# Patient Record
Sex: Male | Born: 1975 | Race: Black or African American | Hispanic: No | Marital: Single | Smoking: Current every day smoker
Health system: Southern US, Community
[De-identification: ages and names within clinical notes are randomized; demographics above are authoritative.]

## PROBLEM LIST (undated history)

## (undated) DIAGNOSIS — M5430 Sciatica, unspecified side: Secondary | ICD-10-CM

## (undated) DIAGNOSIS — A048 Other specified bacterial intestinal infections: Secondary | ICD-10-CM

## (undated) DIAGNOSIS — K279 Peptic ulcer, site unspecified, unspecified as acute or chronic, without hemorrhage or perforation: Secondary | ICD-10-CM

---

## 2004-10-26 ENCOUNTER — Emergency Department: Payer: Self-pay | Admitting: Emergency Medicine

## 2005-02-13 ENCOUNTER — Emergency Department: Payer: Self-pay | Admitting: Internal Medicine

## 2005-02-19 ENCOUNTER — Emergency Department: Payer: Self-pay | Admitting: Emergency Medicine

## 2005-03-18 ENCOUNTER — Emergency Department: Payer: Self-pay | Admitting: Emergency Medicine

## 2010-10-29 ENCOUNTER — Emergency Department (HOSPITAL_COMMUNITY)
Admission: EM | Admit: 2010-10-29 | Discharge: 2010-10-29 | Disposition: A | Discharge status: Home / Self Care | Attending: Emergency Medicine | Admitting: Emergency Medicine

## 2010-10-29 DIAGNOSIS — R209 Unspecified disturbances of skin sensation: Secondary | ICD-10-CM | POA: Insufficient documentation

## 2010-10-29 DIAGNOSIS — G510 Bell's palsy: Secondary | ICD-10-CM | POA: Insufficient documentation

## 2010-10-29 DIAGNOSIS — G501 Atypical facial pain: Secondary | ICD-10-CM | POA: Insufficient documentation

## 2012-09-30 ENCOUNTER — Emergency Department: Payer: Self-pay | Admitting: Unknown Physician Specialty

## 2013-04-29 ENCOUNTER — Emergency Department: Payer: Self-pay | Admitting: Emergency Medicine

## 2014-03-17 ENCOUNTER — Emergency Department: Payer: Self-pay | Admitting: Emergency Medicine

## 2014-03-17 LAB — URINALYSIS, COMPLETE
BILIRUBIN, UR: NEGATIVE
BLOOD: NEGATIVE
Bacteria: NONE SEEN
Glucose,UR: NEGATIVE mg/dL (ref 0–75)
KETONE: NEGATIVE
NITRITE: NEGATIVE
PROTEIN: NEGATIVE
Ph: 5 (ref 4.5–8.0)
SPECIFIC GRAVITY: 1.025 (ref 1.003–1.030)
Squamous Epithelial: NONE SEEN
WBC UR: 87 /HPF (ref 0–5)

## 2014-03-19 ENCOUNTER — Emergency Department: Payer: Self-pay | Admitting: Emergency Medicine

## 2014-03-19 LAB — COMPREHENSIVE METABOLIC PANEL
Albumin: 3.8 g/dL (ref 3.4–5.0)
Alkaline Phosphatase: 109 U/L
Anion Gap: 4 — ABNORMAL LOW (ref 7–16)
BILIRUBIN TOTAL: 2.1 mg/dL — AB (ref 0.2–1.0)
BUN: 11 mg/dL (ref 7–18)
CALCIUM: 8.5 mg/dL (ref 8.5–10.1)
Chloride: 107 mmol/L (ref 98–107)
Co2: 28 mmol/L (ref 21–32)
Creatinine: 0.94 mg/dL (ref 0.60–1.30)
Glucose: 128 mg/dL — ABNORMAL HIGH (ref 65–99)
Osmolality: 279 (ref 275–301)
POTASSIUM: 3.6 mmol/L (ref 3.5–5.1)
SGOT(AST): 181 U/L — ABNORMAL HIGH (ref 15–37)
SGPT (ALT): 75 U/L — ABNORMAL HIGH
SODIUM: 139 mmol/L (ref 136–145)
Total Protein: 7 g/dL (ref 6.4–8.2)

## 2014-03-19 LAB — CBC
HCT: 44.9 % (ref 40.0–52.0)
HGB: 14.4 g/dL (ref 13.0–18.0)
MCH: 32.2 pg (ref 26.0–34.0)
MCHC: 32 g/dL (ref 32.0–36.0)
MCV: 101 fL — ABNORMAL HIGH (ref 80–100)
PLATELETS: 155 10*3/uL (ref 150–440)
RBC: 4.46 10*6/uL (ref 4.40–5.90)
RDW: 12.6 % (ref 11.5–14.5)
WBC: 11.5 10*3/uL — AB (ref 3.8–10.6)

## 2014-03-19 LAB — LIPASE, BLOOD: LIPASE: 71 U/L — AB (ref 73–393)

## 2015-01-09 ENCOUNTER — Emergency Department
Admission: EM | Admit: 2015-01-09 | Discharge: 2015-01-09 | Disposition: A | Discharge status: Home / Self Care | Attending: Emergency Medicine | Admitting: Emergency Medicine

## 2015-01-09 ENCOUNTER — Encounter: Payer: Self-pay | Admitting: Emergency Medicine

## 2015-01-09 DIAGNOSIS — K649 Unspecified hemorrhoids: Secondary | ICD-10-CM | POA: Insufficient documentation

## 2015-01-09 DIAGNOSIS — Z720 Tobacco use: Secondary | ICD-10-CM | POA: Insufficient documentation

## 2015-01-09 DIAGNOSIS — K5900 Constipation, unspecified: Secondary | ICD-10-CM | POA: Insufficient documentation

## 2015-01-09 LAB — CBC WITH DIFFERENTIAL/PLATELET
Basophils Absolute: 0.2 10*3/uL — ABNORMAL HIGH (ref 0–0.1)
Basophils Relative: 2 %
Eosinophils Absolute: 0.2 10*3/uL (ref 0–0.7)
Eosinophils Relative: 2 %
HCT: 41.3 % (ref 40.0–52.0)
Hemoglobin: 13.9 g/dL (ref 13.0–18.0)
LYMPHS ABS: 1.3 10*3/uL (ref 1.0–3.6)
Lymphocytes Relative: 12 %
MCH: 32.4 pg (ref 26.0–34.0)
MCHC: 33.8 g/dL (ref 32.0–36.0)
MCV: 96 fL (ref 80.0–100.0)
MONO ABS: 0.4 10*3/uL (ref 0.2–1.0)
MONOS PCT: 4 %
Neutro Abs: 8.3 10*3/uL — ABNORMAL HIGH (ref 1.4–6.5)
Neutrophils Relative %: 80 %
Platelets: 168 10*3/uL (ref 150–440)
RBC: 4.3 MIL/uL — ABNORMAL LOW (ref 4.40–5.90)
RDW: 12 % (ref 11.5–14.5)
WBC: 10.3 10*3/uL (ref 3.8–10.6)

## 2015-01-09 LAB — COMPREHENSIVE METABOLIC PANEL
ALBUMIN: 4.3 g/dL (ref 3.5–5.0)
ALT: 20 U/L (ref 17–63)
AST: 28 U/L (ref 15–41)
Alkaline Phosphatase: 67 U/L (ref 38–126)
Anion gap: 5 (ref 5–15)
BILIRUBIN TOTAL: 1 mg/dL (ref 0.3–1.2)
BUN: 10 mg/dL (ref 6–20)
CHLORIDE: 106 mmol/L (ref 101–111)
CO2: 28 mmol/L (ref 22–32)
Calcium: 8.8 mg/dL — ABNORMAL LOW (ref 8.9–10.3)
Creatinine, Ser: 0.96 mg/dL (ref 0.61–1.24)
GFR calc Af Amer: >60 mL/min (ref >60)
GFR calc non Af Amer: >60 mL/min (ref >60)
GLUCOSE: 105 mg/dL — AB (ref 65–99)
Potassium: 3.6 mmol/L (ref 3.5–5.1)
Sodium: 139 mmol/L (ref 135–145)
Total Protein: 7.1 g/dL (ref 6.5–8.1)

## 2015-01-09 NOTE — ED Notes (Signed)
This am went to have bm and passes some blood, normal colo0r stool, abd soft non tender

## 2015-01-09 NOTE — ED Provider Notes (Signed)
[facility] Emergency Department Provider Note  ____________________________________________  Time seen: On arrival  I have reviewed the triage vital signs and the nursing notes.   HISTORY  Chief Complaint Rectal Bleeding    HPI Richard Parks is a 39 y.o. male who presents with complaints of rectal bleeding. He noticed bright red blood on the toilet paper and it will bowl after having a bowel movement this morning while at work. He denies light headedness. He is not on blood thinners. He has no history of internal bleeding. No fevers no chills.    History reviewed. No pertinent past medical history.  There are no active problems to display for this patient.   History reviewed. No pertinent past surgical history.  No current outpatient prescriptions on file.  Allergies Review of patient's allergies indicates no known allergies.  No family history on file.  Social History History  Substance Use Topics  . Smoking status: Current Every Day Smoker  . Smokeless tobacco: Not on file  . Alcohol Use: Yes    Review of Systems  Constitutional: Negative for fever.  Abdominal: Positive for constipation Genitourinary: Negative for dysuria. Musculoskeletal: Negative for back pain. Skin: Negative for rash. Neurological: Negative for headaches or dizziness   ____________________________________________   PHYSICAL EXAM:  VITAL SIGNS: ED Triage Vitals  Enc Vitals Group     BP 01/09/15 1107 104/59 mmHg     Pulse Rate 01/09/15 1107 68     Resp 01/09/15 1107 20     Temp 01/09/15 1107 98.3 F (36.8 C)     Temp Source 01/09/15 1107 Oral     SpO2 01/09/15 1107 99 %     Weight 01/09/15 1107 164 lb (74.39 kg)     Height 01/09/15 1107  (1.753 m)     Head Cir --      Peak Flow --      Pain Score 01/09/15 1108 2     Pain Loc --      Pain Edu? --      Excl. in GC? --     Constitutional: Alert and oriented. Well appearing and in no  distress. Eyes: Conjunctivae are normal.  ENT   Head: Normocephalic and atraumatic.   Mouth/Throat: Mucous membranes are moist. Cardiovascular: Normal rate, regular rhythm.  Respiratory: Normal respiratory effort without tachypnea nor retractions.  Gastrointestinal: Soft and non-tender in all quadrants. No distention. There is no CVA tenderness. Positive hemorrhoids noted, no active bleeding Musculoskeletal: Nontender with normal range of motion in all extremities. Neurologic:  Normal speech and language. No gross focal neurologic deficits are appreciated. Skin:  Skin is warm, dry and intact. No rash noted. Psychiatric: Mood and affect are normal. Patient exhibits appropriate insight and judgment.  ____________________________________________    LABS (pertinent positives/negatives)  Labs Reviewed  CBC WITH DIFFERENTIAL/PLATELET - Abnormal; Notable for the following:    RBC 4.30 (*)    Neutro Abs 8.3 (*)    Basophils Absolute 0.2 (*)    All other components within normal limits  COMPREHENSIVE METABOLIC PANEL - Abnormal; Notable for the following:    Glucose, Bld 105 (*)    Calcium 8.8 (*)    All other components within normal limits    ____________________________________________     ____________________________________________    RADIOLOGY I have personally reviewed any xrays that were ordered on this patient: None  ____________________________________________   PROCEDURES  Procedure(s) performed: none   ____________________________________________   INITIAL IMPRESSION / ASSESSMENT AND PLAN /  ED COURSE  Pertinent labs & imaging results that were available during my care of the patient were reviewed by me and considered in my medical decision making (see chart for details).  Exam consistent with hemorrhoids. CBC normal. Recommend supportive care, follow-up with PCP as needed.  ____________________________________________   FINAL CLINICAL  IMPRESSION(S) / ED DIAGNOSES  Final diagnoses:  Hemorrhoids, unspecified hemorrhoid type     Jene Everyobert Makiah Foye, MD 01/09/15 1222

## 2015-01-09 NOTE — Discharge Instructions (Signed)

## 2015-01-09 NOTE — ED Notes (Signed)
States he noticed bright red blood after using the bathroom this am

## 2016-10-09 ENCOUNTER — Emergency Department
Admission: EM | Admit: 2016-10-09 | Discharge: 2016-10-09 | Disposition: A | Discharge status: Home / Self Care | Attending: Student in an Organized Health Care Education/Training Program | Admitting: Student in an Organized Health Care Education/Training Program

## 2016-10-09 DIAGNOSIS — F17200 Nicotine dependence, unspecified, uncomplicated: Secondary | ICD-10-CM | POA: Insufficient documentation

## 2016-10-09 DIAGNOSIS — K625 Hemorrhage of anus and rectum: Secondary | ICD-10-CM

## 2016-10-09 DIAGNOSIS — K648 Other hemorrhoids: Secondary | ICD-10-CM

## 2016-10-09 LAB — COMPREHENSIVE METABOLIC PANEL
ALBUMIN: 4.3 g/dL (ref 3.5–5.0)
ALT: 19 U/L (ref 17–63)
AST: 25 U/L (ref 15–41)
Alkaline Phosphatase: 78 U/L (ref 38–126)
Anion gap: 5 (ref 5–15)
BUN: 12 mg/dL (ref 6–20)
CHLORIDE: 105 mmol/L (ref 101–111)
CO2: 30 mmol/L (ref 22–32)
CREATININE: 1.06 mg/dL (ref 0.61–1.24)
Calcium: 8.9 mg/dL (ref 8.9–10.3)
GFR calc Af Amer: >60 mL/min (ref >60)
GLUCOSE: 112 mg/dL — AB (ref 65–99)
Potassium: 3.5 mmol/L (ref 3.5–5.1)
SODIUM: 140 mmol/L (ref 135–145)
Total Bilirubin: 1 mg/dL (ref 0.3–1.2)
Total Protein: 7.3 g/dL (ref 6.5–8.1)

## 2016-10-09 LAB — CBC WITH DIFFERENTIAL/PLATELET
BASOS ABS: 0 10*3/uL (ref 0–0.1)
BASOS PCT: 1 %
Eosinophils Absolute: 0.1 10*3/uL (ref 0–0.7)
Eosinophils Relative: 1 %
HCT: 40.8 % (ref 40.0–52.0)
Hemoglobin: 13.9 g/dL (ref 13.0–18.0)
LYMPHS PCT: 14 %
Lymphs Abs: 1.3 10*3/uL (ref 1.0–3.6)
MCH: 32.5 pg (ref 26.0–34.0)
MCHC: 34.1 g/dL (ref 32.0–36.0)
MCV: 95.2 fL (ref 80.0–100.0)
Monocytes Absolute: 0.4 10*3/uL (ref 0.2–1.0)
Monocytes Relative: 5 %
Neutro Abs: 7 10*3/uL — ABNORMAL HIGH (ref 1.4–6.5)
Neutrophils Relative %: 79 %
Platelets: 159 10*3/uL (ref 150–440)
RBC: 4.28 MIL/uL — AB (ref 4.40–5.90)
RDW: 12.3 % (ref 11.5–14.5)
WBC: 8.8 10*3/uL (ref 3.8–10.6)

## 2016-10-09 MED ORDER — POLYETHYLENE GLYCOL 3350 17 G PO PACK: 17.0000 g | PACK | Freq: Every day | ORAL | 0 refills | Status: DC

## 2016-10-09 MED ORDER — HYDROCORTISONE 2.5 % RE CREA: 1.0000 "application " | TOPICAL_CREAM | Freq: Two times a day (BID) | RECTAL | 0 refills | Status: DC

## 2016-10-09 NOTE — ED Provider Notes (Signed)
[facility] Emergency Department Provider Note    None    (approximate)  I have reviewed the triage vital signs and the nursing notes.   HISTORY  Chief Complaint Rectal Bleeding    HPI Richard Parks is a 41 y.o. male presents with chief complaint of bright red blood per rectum that started last night. Has had 3 episodes of bright red blood. States he was straining to move his bowels and has been having issues with constipation. Denies any epigastric pain. Several years ago was treated for H. pylori with a history of long-standing heartburn and gastritis but the symptoms have resolved. He denies any fainting spells. He is able to walk into the ER with no acute distress. Denies any melena. Is not on any blood thinners. No history of heart failure or liver disease.  No nausea or vomiting.   PMH: reflux FMH: no bleeding disorders PSH: no recent surgeries There are no active problems to display for this patient.     Prior to Admission medications   Not on File    Allergies Patient has no known allergies.    Social History Social History  Substance Use Topics  . Smoking status: Current Every Day Smoker  . Smokeless tobacco: Not on file  . Alcohol use Yes    Review of Systems Patient denies headaches, rhinorrhea, blurry vision, numbness, shortness of breath, chest pain, edema, cough, abdominal pain, nausea, vomiting, diarrhea, dysuria, fevers, rashes or hallucinations unless otherwise stated above in HPI. ____________________________________________   PHYSICAL EXAM:  VITAL SIGNS: Vitals:   10/09/16 0812  BP: 137/81  Pulse: 91  Resp: 18  Temp: 98.8 F (37.1 C)    Constitutional: Alert and oriented. Well appearing and in no acute distress. Eyes: Conjunctivae are normal. PERRL. EOMI. Head: Atraumatic. Nose: No congestion/rhinnorhea. Mouth/Throat: Mucous membranes are moist.  Oropharynx non-erythematous. Neck: No stridor. Painless  ROM. No cervical spine tenderness to palpation Hematological/Lymphatic/Immunilogical: No cervical lymphadenopathy. Cardiovascular: Normal rate, regular rhythm. Grossly normal heart sounds.  Good peripheral circulation. Respiratory: Normal respiratory effort.  No retractions. Lungs CTAB. Gastrointestinal: Soft and nontender. No distention. No abdominal bruits. No CVA tenderness. Genitourinary: Slightly tender non-thrombosed right hemorrhoid. No melena. No bright red blood in the rectum. Musculoskeletal: No lower extremity tenderness nor edema.  No joint effusions. Neurologic:  Normal speech and language. No gross focal neurologic deficits are appreciated. No gait instability. Skin:  Skin is warm, dry and intact. No rash noted. Psychiatric: Mood and affect are normal. Speech and behavior are normal.  ____________________________________________   LABS (all labs ordered are listed, but only abnormal results are displayed)  Results for orders placed or performed during the hospital encounter of 10/09/16 (from the past 24 hour(s))  CBC with Differential/Platelet     Status: Abnormal   Collection Time: 10/09/16  8:21 AM  Result Value Ref Range   WBC 8.8 3.8 - 10.6 K/uL   RBC 4.28 (L) 4.40 - 5.90 MIL/uL   Hemoglobin 13.9 13.0 - 18.0 g/dL   HCT 16.1 09.6 - 04.5 %   MCV 95.2 80.0 - 100.0 fL   MCH 32.5 26.0 - 34.0 pg   MCHC 34.1 32.0 - 36.0 g/dL   RDW 40.9 81.1 - 91.4 %   Platelets 159 150 - 440 K/uL   Neutrophils Relative % 79 %   Neutro Abs 7.0 (H) 1.4 - 6.5 K/uL   Lymphocytes Relative 14 %   Lymphs Abs 1.3 1.0 - 3.6 K/uL  Monocytes Relative 5 %   Monocytes Absolute 0.4 0.2 - 1.0 K/uL   Eosinophils Relative 1 %   Eosinophils Absolute 0.1 0 - 0.7 K/uL   Basophils Relative 1 %   Basophils Absolute 0.0 0 - 0.1 K/uL  Comprehensive metabolic panel     Status: Abnormal   Collection Time: 10/09/16  8:21 AM  Result Value Ref Range   Sodium 140 135 - 145 mmol/L   Potassium 3.5 3.5 - 5.1  mmol/L   Chloride 105 101 - 111 mmol/L   CO2 30 22 - 32 mmol/L   Glucose, Bld 112 (H) 65 - 99 mg/dL   BUN 12 6 - 20 mg/dL   Creatinine, Ser 4.69 0.61 - 1.24 mg/dL   Calcium 8.9 8.9 - 62.9 mg/dL   Total Protein 7.3 6.5 - 8.1 g/dL   Albumin 4.3 3.5 - 5.0 g/dL   AST 25 15 - 41 U/L   ALT 19 17 - 63 U/L   Alkaline Phosphatase 78 38 - 126 U/L   Total Bilirubin 1.0 0.3 - 1.2 mg/dL   GFR calc non Af Amer >60 >60 mL/min   GFR calc Af Amer >60 >60 mL/min   Anion gap 5 5 - 15   ____________________________________________ ____________________________________________  RADIOLOGY   ____________________________________________   PROCEDURES  Procedure(s) performed:  Procedures    Critical Care performed: no ____________________________________________   INITIAL IMPRESSION / ASSESSMENT AND PLAN / ED COURSE  Pertinent labs & imaging results that were available during my care of the patient were reviewed by me and considered in my medical decision making (see chart for details).  DDX: hemorrhoid, fissure, diverticulosis, proctitis  [name] is a 41 y.o. who presents to the ED with 3 episodes of bright red blood per rectum after straining.  Patient is AFVSS in ED. Exam as above. Given current presentation have considered the above differential.  Abdominal exam soft and benign. Blood work is reassuring. Patient is low risk by Glasgow-Blatchford Bleeding score.  Exam is consistent with probable hemorrhoid causing pain. Did not identify any evidence of fissure. No rectal masses. She's not on any blood thinners and in no acute distress with minimal bleeding to feel patient is appropriate for outpatient follow-up.  Have discussed with the patient and available family all diagnostics and treatments performed thus far and all questions were answered to the best of my ability. The patient demonstrates understanding and agreement with plan.        ____________________________________________   FINAL CLINICAL IMPRESSION(S) / ED DIAGNOSES  Final diagnoses:  Other hemorrhoids  Bright red rectal bleeding      NEW MEDICATIONS STARTED DURING THIS VISIT:  New Prescriptions   No medications on file     Note:  This document was prepared using Dragon voice recognition software and may include unintentional dictation errors.    Willy Eddy, MD 10/09/16 [phone]

## 2016-10-09 NOTE — ED Triage Notes (Signed)
Pt reports that he has noticed bright red blood in his stools 3 times, last time was this am, pt denies pain.

## 2016-12-10 ENCOUNTER — Emergency Department
Admission: EM | Admit: 2016-12-10 | Discharge: 2016-12-10 | Disposition: A | Discharge status: Home / Self Care | Attending: Emergency Medicine | Admitting: Emergency Medicine

## 2016-12-10 DIAGNOSIS — Z79899 Other long term (current) drug therapy: Secondary | ICD-10-CM | POA: Insufficient documentation

## 2016-12-10 DIAGNOSIS — F17200 Nicotine dependence, unspecified, uncomplicated: Secondary | ICD-10-CM | POA: Insufficient documentation

## 2016-12-10 DIAGNOSIS — W57XXXA Bitten or stung by nonvenomous insect and other nonvenomous arthropods, initial encounter: Secondary | ICD-10-CM | POA: Insufficient documentation

## 2016-12-10 DIAGNOSIS — R21 Rash and other nonspecific skin eruption: Secondary | ICD-10-CM | POA: Insufficient documentation

## 2016-12-10 MED ORDER — METHYLPREDNISOLONE 4 MG PO TBPK: ORAL_TABLET | ORAL | 0 refills | Status: DC

## 2016-12-10 MED ORDER — HYDROXYZINE HCL 50 MG PO TABS
50.0000 mg | ORAL_TABLET | Freq: Once | ORAL | Status: AC
  Administered 2016-12-10: 50 mg via ORAL
  Filled 2016-12-10: qty 1

## 2016-12-10 MED ORDER — HYDROXYZINE HCL 50 MG PO TABS: 50.0000 mg | ORAL_TABLET | Freq: Three times a day (TID) | ORAL | 0 refills | Status: DC | PRN

## 2016-12-10 NOTE — ED Provider Notes (Signed)
[facility] Emergency Department Provider Note  ` ____________________________________________   First MD Initiated Contact with Patient 12/10/16 [phone]     (approximate)  I have reviewed the triage vital signs and the nursing notes.   HISTORY  Chief Complaint Insect Bite    HPI Richard Parks is a 41 y.o. male patient complain itching right lateral chest wall secondary to insect bites for 2 days.Patient denies pain with this complaint. No palliative measures for this complaint. He denies fever or chills. She states for the asked him to be outdoors.   History reviewed. No pertinent past medical history.  There are no active problems to display for this patient.   History reviewed. No pertinent surgical history.  Prior to Admission medications   Medication Sig Start Date End Date Taking? Authorizing Provider  hydrocortisone (ANUSOL-HC) 2.5 % rectal cream Place 1 application rectally 2 (two) times daily. 10/09/16   Willy Eddy, MD  hydrOXYzine (ATARAX/VISTARIL) 50 MG tablet Take 1 tablet (50 mg total) by mouth 3 (three) times daily as needed for itching. 12/10/16   Joni Reining, PA-C  methylPREDNISolone (MEDROL DOSEPAK) 4 MG TBPK tablet Take Tapered dose as directed 12/10/16   Joni Reining, PA-C  polyethylene glycol [facility] / GLYCOLAX) packet Take 17 g by mouth daily. Mix one tablespoon with 8oz of your favorite juice or water every day until you are having soft formed stools. Then start taking once daily if you didn't have a stool the day before. 10/09/16   Willy Eddy, MD    Allergies Patient has no known allergies.  No family history on file.  Social History Social History  Substance Use Topics  . Smoking status: Current Every Day Smoker  . Smokeless tobacco: Never Used  . Alcohol use Yes    Review of Systems  Constitutional: No fever/chills Eyes: No visual changes. ENT: No sore throat. Cardiovascular: Denies chest  pain. Respiratory: Denies shortness of breath. Gastrointestinal: No abdominal pain.  No nausea, no vomiting.  No diarrhea.  No constipation. Genitourinary: Negative for dysuria. Musculoskeletal: Negative for back pain. Skin: Positive for rash. Neurological: Negative for headaches, focal weakness or numbness.   ____________________________________________   PHYSICAL EXAM:  VITAL SIGNS: ED Triage Vitals  Enc Vitals Group     BP 12/10/16 0813 (!) 148/89     Pulse Rate 12/10/16 0813 72     Resp 12/10/16 0812 16     Temp 12/10/16 0812 98.5 F (36.9 C)     Temp Source 12/10/16 0812 Oral     SpO2 12/10/16 0813 97 %     Weight 12/10/16 0812 167 lb (75.8 kg)     Height 12/10/16 0812 5\' 9"  (1.753 m)     Head Circumference --      Peak Flow --      Pain Score --      Pain Loc --      Pain Edu? --      Excl. in GC? --     Constitutional: Alert and oriented. Well appearing and in no acute distress. Cardiovascular: Normal rate, regular rhythm. Grossly normal heart sounds.  Good peripheral circulation. Respiratory: Normal respiratory effort.  No retractions. Lungs CTAB. Neurologic:  Normal speech and language. No gross focal neurologic deficits are appreciated. No gait instability. Skin:  Skin is warm, dry and intact. 3 erythematous macular lesions right lateral chest wall . Psychiatric: Mood and affect are normal. Speech and behavior are normal.  ____________________________________________   LABS (  all labs ordered are listed, but only abnormal results are displayed)  Labs Reviewed - No data to display ____________________________________________  EKG   ____________________________________________  RADIOLOGY  No results found.  ____________________________________________   PROCEDURES  Procedure(s) performed: None  Procedures  Critical Care performed: No  ____________________________________________   INITIAL IMPRESSION / ASSESSMENT AND PLAN / ED  COURSE  Pertinent labs & imaging results that were available during my care of the patient were reviewed by me and considered in my medical decision making (see chart for details).  Localized reaction insect bite. Patient given discharge care instructions. Patient [facility].      ____________________________________________   FINAL CLINICAL IMPRESSION(S) / ED DIAGNOSES  Final diagnoses:  Insect bite, initial encounter      NEW MEDICATIONS STARTED DURING THIS VISIT:  New Prescriptions   HYDROXYZINE (ATARAX/VISTARIL) 50 MG TABLET    Take 1 tablet (50 mg total) by mouth 3 (three) times daily as needed for itching.   METHYLPREDNISOLONE (MEDROL DOSEPAK) 4 MG TBPK TABLET    Take Tapered dose as directed     Note:  This document was prepared using Dragon voice recognition software and may include unintentional dictation errors.    Joni ReiningSmith, Garen Woolbright K, PA-C 12/10/16 [phone]    Rockne MenghiniNorman, Anne-Caroline, MD 12/10/16 1539

## 2016-12-10 NOTE — ED Triage Notes (Signed)
Pt c/o 3-4 insect bites on the right lateral rib area that he noticed today, states "it feels like something is sticking me"..Marland Kitchen

## 2016-12-10 NOTE — ED Notes (Signed)
Patient presents to the ED with red raised areas and redness around areas to patient's right torso.  Patient is complaining of itching and states something feels like it's "sticking him".  Patient is in no obvious distress at this time.

## 2017-02-07 ENCOUNTER — Emergency Department
Admission: EM | Admit: 2017-02-07 | Discharge: 2017-02-07 | Disposition: A | Discharge status: Home / Self Care | Attending: Emergency Medicine | Admitting: Emergency Medicine

## 2017-02-07 ENCOUNTER — Encounter: Payer: Self-pay | Admitting: Emergency Medicine

## 2017-02-07 DIAGNOSIS — Z79899 Other long term (current) drug therapy: Secondary | ICD-10-CM | POA: Insufficient documentation

## 2017-02-07 DIAGNOSIS — F17200 Nicotine dependence, unspecified, uncomplicated: Secondary | ICD-10-CM | POA: Insufficient documentation

## 2017-02-07 DIAGNOSIS — A749 Chlamydial infection, unspecified: Secondary | ICD-10-CM

## 2017-02-07 LAB — URINALYSIS, COMPLETE (UACMP) WITH MICROSCOPIC
BACTERIA UA: NONE SEEN
Bilirubin Urine: NEGATIVE
Glucose, UA: NEGATIVE mg/dL
Hgb urine dipstick: NEGATIVE
KETONES UR: NEGATIVE mg/dL
Nitrite: NEGATIVE
Protein, ur: 30 mg/dL — AB
Specific Gravity, Urine: 1.032 — ABNORMAL HIGH (ref 1.005–1.030)
pH: 5 (ref 5.0–8.0)

## 2017-02-07 LAB — CHLAMYDIA/NGC RT PCR (ARMC ONLY)
Chlamydia Tr: DETECTED — AB
N gonorrhoeae: NOT DETECTED

## 2017-02-07 MED ORDER — AZITHROMYCIN 500 MG PO TABS
1000.0000 mg | ORAL_TABLET | Freq: Once | ORAL | Status: AC
  Administered 2017-02-07: 1000 mg via ORAL
  Filled 2017-02-07: qty 2

## 2017-02-07 MED ORDER — CEFTRIAXONE SODIUM 250 MG IJ SOLR
250.0000 mg | Freq: Once | INTRAMUSCULAR | Status: AC
  Administered 2017-02-07: 250 mg via INTRAMUSCULAR
  Filled 2017-02-07: qty 250

## 2017-02-07 NOTE — Discharge Instructions (Signed)
Advised to inform sexual partner or diagnosis.

## 2017-02-07 NOTE — ED Provider Notes (Signed)
[facility] Emergency Department Provider Note   ____________________________________________   First MD Initiated Contact with Patient 02/07/17 1614     (approximate)  I have reviewed the triage vital signs and the nursing notes.   HISTORY  Chief Complaint Exposure to STD    HPI Richard Parks is a 41 y.o. male patient complaining of 2-3 days of dysuria. Patient denies urethral discharge. Patient stated last sexual contact was one week ago unprotected.Patient rates his pain discomfort as a 3/10. No palliative measures for complaint.   History reviewed. No pertinent past medical history.  There are no active problems to display for this patient.   History reviewed. No pertinent surgical history.  Prior to Admission medications   Medication Sig Start Date End Date Taking? Authorizing Provider  hydrocortisone (ANUSOL-HC) 2.5 % rectal cream Place 1 application rectally 2 (two) times daily. 10/09/16   Willy Eddy, MD  hydrOXYzine (ATARAX/VISTARIL) 50 MG tablet Take 1 tablet (50 mg total) by mouth 3 (three) times daily as needed for itching. 12/10/16   Joni Reining, PA-C  methylPREDNISolone (MEDROL DOSEPAK) 4 MG TBPK tablet Take Tapered dose as directed 12/10/16   Joni Reining, PA-C  polyethylene glycol [facility] / GLYCOLAX) packet Take 17 g by mouth daily. Mix one tablespoon with 8oz of your favorite juice or water every day until you are having soft formed stools. Then start taking once daily if you didn't have a stool the day before. 10/09/16   Willy Eddy, MD    Allergies Patient has no known allergies.  No family history on file.  Social History Social History  Substance Use Topics  . Smoking status: Current Every Day Smoker  . Smokeless tobacco: Never Used  . Alcohol use Yes    Review of Systems  Constitutional: No fever/chills Eyes: No visual changes. ENT: No sore throat. Cardiovascular: Denies chest pain. Respiratory:  Denies shortness of breath. Gastrointestinal: No abdominal pain.  No nausea, no vomiting.  No diarrhea.  No constipation. Genitourinary: Positive for dysuria. Musculoskeletal: Negative for back pain. Skin: Negative for rash. Neurological: Negative for headaches, focal weakness or numbness.   ____________________________________________   PHYSICAL EXAM:  VITAL SIGNS: ED Triage Vitals  Enc Vitals Group     BP 02/07/17 1611 (!) 143/89     Pulse Rate 02/07/17 1611 (!) 116     Resp 02/07/17 1611 16     Temp 02/07/17 1611 97.9 F (36.6 C)     Temp Source 02/07/17 1611 Oral     SpO2 02/07/17 1611 100 %     Weight 02/07/17 1609 170 lb (77.1 kg)     Height 02/07/17 1609 5\' 9"  (1.753 m)     Head Circumference --      Peak Flow --      Pain Score --      Pain Loc --      Pain Edu? --      Excl. in GC? --     Constitutional: Alert and oriented. Well appearing and in no acute distress. Cardiovascular: Normal rate, regular rhythm. Grossly normal heart sounds.  Good peripheral circulation. Respiratory: Normal respiratory effort.  No retractions. Lungs CTAB. Gastrointestinal: Soft and nontender. No distention. No abdominal bruits. No CVA tenderness. Musculoskeletal: No lower extremity tenderness nor edema.  No joint effusions. Neurologic:  Normal speech and language. No gross focal neurologic deficits are appreciated. No gait instability. Skin:  Skin is warm, dry and intact. No rash noted. Psychiatric: Mood and  affect are normal. Speech and behavior are normal.  ____________________________________________   LABS (all labs ordered are listed, but only abnormal results are displayed)  Labs Reviewed  CHLAMYDIA/NGC RT PCR ([facility] ONLY) - Abnormal; Notable for the following:       Result Value   Chlamydia Tr DETECTED (*)    All other components within normal limits  URINALYSIS, COMPLETE (UACMP) WITH MICROSCOPIC - Abnormal; Notable for the following:    Color, Urine AMBER (*)     APPearance CLEAR (*)    Specific Gravity, Urine 1.032 (*)    Protein, ur 30 (*)    Leukocytes, UA TRACE (*)    Squamous Epithelial / LPF 0-5 (*)    All other components within normal limits   ____________________________________________  EKG   ____________________________________________  RADIOLOGY  No results found.  ____________________________________________   PROCEDURES  Procedure(s) performed: None  Procedures  Critical Care performed: No  ____________________________________________   INITIAL IMPRESSION / ASSESSMENT AND PLAN / ED COURSE  Pertinent labs & imaging results that were available during my care of the patient were reviewed by me and considered in my medical decision making (see chart for details).  Dysuria secondary to chlamydia. Patient treated with Rocephin and Zithromax. Patient advised to notify sexual partner diagnosis. Patient advised follow-up with the community health [facility].      ____________________________________________   FINAL CLINICAL IMPRESSION(S) / ED DIAGNOSES  Final diagnoses:  Chlamydia      NEW MEDICATIONS STARTED DURING THIS VISIT:  New Prescriptions   No medications on file     Note:  This document was prepared using Dragon voice recognition software and may include unintentional dictation errors.    Joni Reining, PA-C 02/07/17 Vinnie Langton    Jene Every, MD 02/08/17 [phone]

## 2017-02-07 NOTE — ED Triage Notes (Signed)
C/O burning with urination.  Here for an STD check.

## 2017-02-25 ENCOUNTER — Encounter: Payer: Self-pay | Admitting: Emergency Medicine

## 2017-02-25 ENCOUNTER — Emergency Department
Admission: EM | Admit: 2017-02-25 | Discharge: 2017-02-25 | Disposition: A | Discharge status: Home / Self Care | Payer: Self-pay | Attending: Emergency Medicine | Admitting: Emergency Medicine

## 2017-02-25 ENCOUNTER — Emergency Department: Payer: Self-pay

## 2017-02-25 DIAGNOSIS — W208XXA Other cause of strike by thrown, projected or falling object, initial encounter: Secondary | ICD-10-CM | POA: Insufficient documentation

## 2017-02-25 DIAGNOSIS — Z79899 Other long term (current) drug therapy: Secondary | ICD-10-CM | POA: Insufficient documentation

## 2017-02-25 DIAGNOSIS — Y93H3 Activity, building and construction: Secondary | ICD-10-CM | POA: Insufficient documentation

## 2017-02-25 DIAGNOSIS — S9031XA Contusion of right foot, initial encounter: Secondary | ICD-10-CM | POA: Insufficient documentation

## 2017-02-25 DIAGNOSIS — Y990 Civilian activity done for income or pay: Secondary | ICD-10-CM | POA: Insufficient documentation

## 2017-02-25 DIAGNOSIS — Y929 Unspecified place or not applicable: Secondary | ICD-10-CM | POA: Insufficient documentation

## 2017-02-25 DIAGNOSIS — F17200 Nicotine dependence, unspecified, uncomplicated: Secondary | ICD-10-CM | POA: Insufficient documentation

## 2017-02-25 MED ORDER — IBUPROFEN 600 MG PO TABS: 600.0000 mg | ORAL_TABLET | Freq: Three times a day (TID) | ORAL | 0 refills | Status: DC | PRN

## 2017-02-25 MED ORDER — IBUPROFEN 600 MG PO TABS
600.0000 mg | ORAL_TABLET | Freq: Once | ORAL | Status: AC
  Administered 2017-02-25: 600 mg via ORAL
  Filled 2017-02-25: qty 1

## 2017-02-25 MED ORDER — HYDROCODONE-ACETAMINOPHEN 5-325 MG PO TABS: 1.0000 | ORAL_TABLET | Freq: Four times a day (QID) | ORAL | 0 refills | Status: DC | PRN

## 2017-02-25 NOTE — ED Provider Notes (Signed)
[facility] Emergency Department Provider Note  ____________________________________________   First MD Initiated Contact with Patient 02/25/17 1325     (approximate)  I have reviewed the triage vital signs and the nursing notes.   HISTORY  Chief Complaint Foot Pain   HPI Richard Parks is a 41 y.o. male is here complaining of right foot pain after dropping a 4 x 8 pieces of wood on at this morning at work. Patient states this is not Workmen's Comp. He denies any other injury then his foot. He rates his pain as a 9/10.  History reviewed. No pertinent past medical history.  There are no active problems to display for this patient.   History reviewed. No pertinent surgical history.  Prior to Admission medications   Medication Sig Start Date End Date Taking? Authorizing Provider  HYDROcodone-acetaminophen (NORCO/VICODIN) 5-325 MG tablet Take 1 tablet by mouth every 6 (six) hours as needed for moderate pain. 02/25/17   Tommi Rumps, PA-C  ibuprofen (ADVIL,MOTRIN) 600 MG tablet Take 1 tablet (600 mg total) by mouth every 8 (eight) hours as needed. 02/25/17   Bridget Hartshorn L, PA-C  polyethylene glycol (MIRALAX / GLYCOLAX) packet Take 17 g by mouth daily. Mix one tablespoon with 8oz of your favorite juice or water every day until you are having soft formed stools. Then start taking once daily if you didn't have a stool the day before. 10/09/16   Willy Eddy, MD    Allergies Patient has no known allergies.  No family history on file.  Social History Social History  Substance Use Topics  . Smoking status: Current Every Day Smoker  . Smokeless tobacco: Never Used  . Alcohol use Yes    Review of Systems Constitutional: No fever/chills Cardiovascular: Denies chest pain. Respiratory: Denies shortness of breath. Gastrointestinal:  No nausea, no vomiting.  Musculoskeletal: Positive for right hip pain. Skin: Positive for bruising. Neurological:  Negative for  focal weakness or numbness. ___________________________________________   PHYSICAL EXAM:  VITAL SIGNS: ED Triage Vitals [02/25/17 1238]  Enc Vitals Group     BP (!) 145/80     Pulse Rate 81     Resp 20     Temp 98.2 F (36.8 C)     Temp Source Oral     SpO2 100 %     Weight      Height      Head Circumference      Peak Flow      Pain Score 9     Pain Loc      Pain Edu?      Excl. in GC?    Constitutional: Alert and oriented. Well appearing and in no acute distress. Eyes: Conjunctivae are normal.  Head: Atraumatic. Neck: No stridor.   Cardiovascular: Normal rate, regular rhythm. Grossly normal heart sounds.  Good peripheral circulation. Respiratory: Normal respiratory effort.  No retractions. Lungs CTAB. Gastrointestinal: Soft and nontender. No distention. Musculoskeletal: Right foot dorsal aspect there is a erythematous area at the base of the great toe with some mild soft tissue swelling. Skin is intact. Capillary refill is less than 3 seconds. Motor sensory function intact distal to the injury. Patient is able to weight-bear but is uncomfortable. Neurologic:  Normal speech and language. No gross focal neurologic deficits are appreciated.  Skin:  Skin is warm, dry and intact. As noted above. Psychiatric: Mood and affect are normal. Speech and behavior are normal.  ____________________________________________   LABS (all labs ordered are  listed, but only abnormal results are displayed)  Labs Reviewed - No data to display  RADIOLOGY  Dg Foot Complete Right  Result Date: 02/25/2017 CLINICAL DATA:  Crush injury.  Distal foot pain. EXAM: RIGHT FOOT COMPLETE - 3+ VIEW COMPARISON:  None. FINDINGS: There is no evidence of fracture or dislocation. There is no evidence of arthropathy or other focal bone abnormality. Soft tissues are unremarkable. IMPRESSION: Negative. Electronically Signed   By: Kennith CenterEric  Mansell M.D.   On: 02/25/2017 13:17     ____________________________________________   PROCEDURES  Procedure(s) performed: None  Procedures  Critical Care performed: No  ____________________________________________   INITIAL IMPRESSION / ASSESSMENT AND PLAN / ED COURSE  Pertinent labs & imaging results that were available during my care of the patient were reviewed by me and considered in my medical decision making (see chart for details).  Patient was made aware that he did not have a fracture of his right foot. He is placed in a postop shoe and given instructions to ice and elevate today and tomorrow. He is given a prescription for ibuprofen and Norco if needed for severe pain. He will follow-up with Dr. Orland Jarredroxler if any continued problems with his foot. Patient was given a note to remain off of work today and tomorrow.    ___________________________________________   FINAL CLINICAL IMPRESSION(S) / ED DIAGNOSES  Final diagnoses:  Contusion of right foot, initial encounter      NEW MEDICATIONS STARTED DURING THIS VISIT:  Discharge Medication List as of 02/25/2017  2:00 PM    START taking these medications   Details  HYDROcodone-acetaminophen (NORCO/VICODIN) 5-325 MG tablet Take 1 tablet by mouth every 6 (six) hours as needed for moderate pain., Starting Tue 02/25/2017, Print    ibuprofen (ADVIL,MOTRIN) 600 MG tablet Take 1 tablet (600 mg total) by mouth every 8 (eight) hours as needed., Starting Tue 02/25/2017, Print         Note:  This document was prepared using Dragon voice recognition software and may include unintentional dictation errors.    Tommi RumpsSummers, Kathya Wilz L, PA-C 02/25/17 1420    Don PerkingVeronese, [place], MD 02/25/17 [phone]

## 2017-02-25 NOTE — ED Triage Notes (Signed)
Pt c/o right foot pain after dropping a 4x8 piece of wood on his foot this am.

## 2017-02-25 NOTE — Discharge Instructions (Signed)
Follow-up with Dr. Orland Jarredroxler if any continued problems with her foot. In taking ibuprofen 600 mg every 8 hours with food for inflammation and pain. Norco every 6 hours as needed for moderate pain. Do not take this medication and drive or operate machinery. Ice and elevate to reduce swelling and pain. Wear postop shoe for the next 2 days.

## 2017-02-25 NOTE — ED Notes (Signed)
See triage note  States he dropped a piece of wall board onto top of foot  Having pain across top of foot..some redness noted near great toe

## 2018-02-08 ENCOUNTER — Emergency Department

## 2018-02-08 ENCOUNTER — Other Ambulatory Visit: Payer: Self-pay

## 2018-02-08 ENCOUNTER — Emergency Department
Admission: EM | Admit: 2018-02-08 | Discharge: 2018-02-08 | Disposition: A | Discharge status: Home / Self Care | Attending: Emergency Medicine | Admitting: Emergency Medicine

## 2018-02-08 DIAGNOSIS — S0181XA Laceration without foreign body of other part of head, initial encounter: Secondary | ICD-10-CM

## 2018-02-08 DIAGNOSIS — T148XXA Other injury of unspecified body region, initial encounter: Secondary | ICD-10-CM

## 2018-02-08 DIAGNOSIS — S0512XA Contusion of eyeball and orbital tissues, left eye, initial encounter: Secondary | ICD-10-CM | POA: Insufficient documentation

## 2018-02-08 DIAGNOSIS — Y9389 Activity, other specified: Secondary | ICD-10-CM | POA: Insufficient documentation

## 2018-02-08 DIAGNOSIS — Z23 Encounter for immunization: Secondary | ICD-10-CM | POA: Insufficient documentation

## 2018-02-08 DIAGNOSIS — S0990XA Unspecified injury of head, initial encounter: Secondary | ICD-10-CM

## 2018-02-08 DIAGNOSIS — S0083XA Contusion of other part of head, initial encounter: Secondary | ICD-10-CM | POA: Insufficient documentation

## 2018-02-08 DIAGNOSIS — F17200 Nicotine dependence, unspecified, uncomplicated: Secondary | ICD-10-CM | POA: Insufficient documentation

## 2018-02-08 DIAGNOSIS — Y998 Other external cause status: Secondary | ICD-10-CM | POA: Insufficient documentation

## 2018-02-08 DIAGNOSIS — Y929 Unspecified place or not applicable: Secondary | ICD-10-CM | POA: Insufficient documentation

## 2018-02-08 DIAGNOSIS — W52XXXA Crushed, pushed or stepped on by crowd or human stampede, initial encounter: Secondary | ICD-10-CM | POA: Insufficient documentation

## 2018-02-08 MED ORDER — HYDROCODONE-ACETAMINOPHEN 5-325 MG PO TABS
1.0000 | ORAL_TABLET | Freq: Once | ORAL | Status: AC
  Administered 2018-02-08: 1 via ORAL
  Filled 2018-02-08: qty 1

## 2018-02-08 MED ORDER — IBUPROFEN 800 MG PO TABS: 800.0000 mg | ORAL_TABLET | Freq: Three times a day (TID) | ORAL | 0 refills | Status: DC | PRN

## 2018-02-08 MED ORDER — ONDANSETRON 4 MG PO TBDP: 4.0000 mg | ORAL_TABLET | Freq: Three times a day (TID) | ORAL | 0 refills | Status: DC | PRN

## 2018-02-08 MED ORDER — TETANUS-DIPHTH-ACELL PERTUSSIS 5-2.5-18.5 LF-MCG/0.5 IM SUSP
0.5000 mL | Freq: Once | INTRAMUSCULAR | Status: AC
  Administered 2018-02-08: 0.5 mL via INTRAMUSCULAR
  Filled 2018-02-08: qty 0.5

## 2018-02-08 MED ORDER — HYDROCODONE-ACETAMINOPHEN 5-325 MG PO TABS: 1.0000 | ORAL_TABLET | Freq: Four times a day (QID) | ORAL | 0 refills | Status: DC | PRN
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
## 2018-02-08 NOTE — Discharge Instructions (Addendum)
1.  You may take pain and nausea medicines as needed  (Norco/Zofran #15). 2.  Apply ice to affected area several times daily. 3.  You may remove sterile tape if still attached in 7 days. 4.  Return to the ER for worsening symptoms, persistent vomiting, lethargy or other concerns.
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
## 2018-05-22 ENCOUNTER — Other Ambulatory Visit: Payer: Self-pay

## 2018-05-22 ENCOUNTER — Emergency Department
Admission: EM | Admit: 2018-05-22 | Discharge: 2018-05-22 | Disposition: A | Discharge status: Home / Self Care | Attending: Student in an Organized Health Care Education/Training Program | Admitting: Student in an Organized Health Care Education/Training Program

## 2018-05-22 ENCOUNTER — Emergency Department

## 2018-05-22 DIAGNOSIS — R197 Diarrhea, unspecified: Secondary | ICD-10-CM | POA: Insufficient documentation

## 2018-05-22 DIAGNOSIS — R51 Headache: Secondary | ICD-10-CM | POA: Insufficient documentation

## 2018-05-22 DIAGNOSIS — R638 Other symptoms and signs concerning food and fluid intake: Secondary | ICD-10-CM | POA: Insufficient documentation

## 2018-05-22 DIAGNOSIS — J101 Influenza due to other identified influenza virus with other respiratory manifestations: Secondary | ICD-10-CM

## 2018-05-22 DIAGNOSIS — F17200 Nicotine dependence, unspecified, uncomplicated: Secondary | ICD-10-CM | POA: Insufficient documentation

## 2018-05-22 DIAGNOSIS — R519 Headache, unspecified: Secondary | ICD-10-CM

## 2018-05-22 LAB — CBC WITH DIFFERENTIAL/PLATELET
Abs Immature Granulocytes: 0.01 10*3/uL (ref 0.00–0.07)
BASOS PCT: 1 %
Basophils Absolute: 0 10*3/uL (ref 0.0–0.1)
Eosinophils Absolute: 0.1 10*3/uL (ref 0.0–0.5)
Eosinophils Relative: 2 %
HCT: 42.5 % (ref 39.0–52.0)
Hemoglobin: 13.9 g/dL (ref 13.0–17.0)
IMMATURE GRANULOCYTES: 0 %
Lymphocytes Relative: 9 %
Lymphs Abs: 0.4 10*3/uL — ABNORMAL LOW (ref 0.7–4.0)
MCH: 31.8 pg (ref 26.0–34.0)
MCHC: 32.7 g/dL (ref 30.0–36.0)
MCV: 97.3 fL (ref 80.0–100.0)
MONO ABS: 0.7 10*3/uL (ref 0.1–1.0)
MONOS PCT: 16 %
NEUTROS PCT: 72 %
Neutro Abs: 3.3 10*3/uL (ref 1.7–7.7)
PLATELETS: 167 10*3/uL (ref 150–400)
RBC: 4.37 MIL/uL (ref 4.22–5.81)
RDW: 11.5 % (ref 11.5–15.5)
WBC: 4.6 10*3/uL (ref 4.0–10.5)
nRBC: 0 % (ref 0.0–0.2)

## 2018-05-22 LAB — COMPREHENSIVE METABOLIC PANEL
ALK PHOS: 79 U/L (ref 38–126)
ALT: 18 U/L (ref 0–44)
ANION GAP: 10 (ref 5–15)
AST: 26 U/L (ref 15–41)
Albumin: 4.2 g/dL (ref 3.5–5.0)
BUN: 10 mg/dL (ref 6–20)
CHLORIDE: 103 mmol/L (ref 98–111)
CO2: 25 mmol/L (ref 22–32)
CREATININE: 1.07 mg/dL (ref 0.61–1.24)
Calcium: 8.9 mg/dL (ref 8.9–10.3)
GFR calc Af Amer: >60 mL/min (ref >60)
GFR calc non Af Amer: >60 mL/min (ref >60)
GLUCOSE: 102 mg/dL — AB (ref 70–99)
Potassium: 3.7 mmol/L (ref 3.5–5.1)
Sodium: 138 mmol/L (ref 135–145)
Total Bilirubin: 1.2 mg/dL (ref 0.3–1.2)
Total Protein: 7.1 g/dL (ref 6.5–8.1)

## 2018-05-22 LAB — INFLUENZA PANEL BY PCR (TYPE A & B)
INFLBPCR: NEGATIVE
Influenza A By PCR: POSITIVE — AB

## 2018-05-22 MED ORDER — PROCHLORPERAZINE EDISYLATE 10 MG/2ML IJ SOLN
10.0000 mg | Freq: Once | INTRAMUSCULAR | Status: AC
  Administered 2018-05-22: 10 mg via INTRAVENOUS
  Filled 2018-05-22: qty 2

## 2018-05-22 MED ORDER — PROMETHAZINE HCL 12.5 MG PO TABS: 12.5000 mg | ORAL_TABLET | Freq: Four times a day (QID) | ORAL | 0 refills | Status: AC | PRN

## 2018-05-22 MED ORDER — KETOROLAC TROMETHAMINE 30 MG/ML IJ SOLN
15.0000 mg | Freq: Once | INTRAMUSCULAR | Status: DC
  Filled 2018-05-22: qty 1

## 2018-05-22 MED ORDER — ACETAMINOPHEN 500 MG PO TABS
1000.0000 mg | ORAL_TABLET | Freq: Once | ORAL | Status: AC
  Administered 2018-05-22: 1000 mg via ORAL
  Filled 2018-05-22: qty 2

## 2018-05-22 MED ORDER — SODIUM CHLORIDE 0.9 % IV BOLUS
1000.0000 mL | Freq: Once | INTRAVENOUS | Status: AC
  Administered 2018-05-22: 1000 mL via INTRAVENOUS

## 2018-05-22 MED ORDER — DIPHENHYDRAMINE HCL 50 MG/ML IJ SOLN
12.5000 mg | Freq: Once | INTRAMUSCULAR | Status: AC
  Administered 2018-05-22: 12.5 mg via INTRAVENOUS
  Filled 2018-05-22: qty 1

## 2018-05-22 NOTE — ED Notes (Signed)
Pt states hes been congested and running a fever at home, pt c/o headache x1week worse the past few days with nausea. Denies vomiting.

## 2018-05-22 NOTE — ED Provider Notes (Signed)
[facility] Emergency Department Provider Note    First MD Initiated Contact with Patient 05/22/18 [phone]     (approximate)  I have reviewed the triage vital signs and the nursing notes.   HISTORY  Chief Complaint Headache    HPI Richard Parks is a 42 y.o. male presents the ER with chief complaint of several days of nausea vomiting diarrhea no headache.  Denies any neck stiffness.  Has not had any measured temperatures at home but last night states that he was having shaking chills and was felt by his spouse who states that he felt very warm at that time.  Has had decreased p.o. intake.  Denies any abdominal pain at this time.  No cough.  Denies any sick contacts.  No recent surgeries.    History reviewed. No pertinent past medical history. History reviewed. No pertinent family history. History reviewed. No pertinent surgical history. There are no active problems to display for this patient.     Prior to Admission medications   Medication Sig Start Date End Date Taking? Authorizing Provider  ibuprofen (ADVIL,MOTRIN) 200 MG tablet Take 200 mg by mouth every 6 (six) hours as needed.   Yes [provider]  HYDROcodone-acetaminophen (NORCO) 5-325 MG tablet Take 1 tablet by mouth every 6 (six) hours as needed for moderate pain. Patient not taking: Reported on 05/22/2018 02/08/18   Irean HongSung, Jade J, MD  ondansetron (ZOFRAN ODT) 4 MG disintegrating tablet Take 1 tablet (4 mg total) by mouth every 8 (eight) hours as needed for nausea or vomiting. Patient not taking: Reported on 05/22/2018 02/08/18   Irean HongSung, Jade J, MD  polyethylene glycol [facility](MIRALAX / Ethelene HalGLYCOLAX) packet Take 17 g by mouth daily. Mix one tablespoon with 8oz of your favorite juice or water every day until you are having soft formed stools. Then start taking once daily if you didn't have a stool the day before. Patient not taking: Reported on 05/22/2018 10/09/16   Willy Eddyobinson, Cartina Brousseau, MD  promethazine  (PHENERGAN) 12.5 MG tablet Take 1 tablet (12.5 mg total) by mouth every 6 (six) hours as needed for nausea or vomiting. 05/22/18   Willy Eddyobinson, Codee Bloodworth, MD    Allergies Patient has no known allergies.    Social History Social History   Tobacco Use  . Smoking status: Current Every Day Smoker  . Smokeless tobacco: Never Used  Substance Use Topics  . Alcohol use: Yes  . Drug use: Not on file    Review of Systems Patient denies headaches, rhinorrhea, blurry vision, numbness, shortness of breath, chest pain, edema, cough, abdominal pain, nausea, vomiting, diarrhea, dysuria, fevers, rashes or hallucinations unless otherwise stated above in HPI. ____________________________________________   PHYSICAL EXAM:  VITAL SIGNS: Vitals:   05/22/18 1030 05/22/18 1220  BP: (!) 104/54 131/66  Pulse: 80 75  Resp:  16  Temp:  98.7 F (37.1 C)  SpO2: 95% 94%    Constitutional: Alert and oriented. Non toxic appearing. Eyes: Conjunctivae are normal.  Head: Atraumatic. Nose: No congestion/rhinnorhea. Mouth/Throat: Mucous membranes are dry Neck: No stridor. Painless ROM. No signs of meningismus Cardiovascular: Normal rate, regular rhythm. Grossly normal heart sounds.  Good peripheral circulation. Respiratory: Normal respiratory effort.  No retractions. Lungs CTAB. Gastrointestinal: Soft and nontender. No distention. No abdominal bruits. No CVA tenderness. Genitourinary:  Musculoskeletal: No lower extremity tenderness nor edema.  No joint effusions. Neurologic:  Normal speech and language. No gross focal neurologic deficits are appreciated. No facial droop Skin:  Skin is warm,  dry and intact. No rash noted. Psychiatric: Mood and affect are normal. Speech and behavior are normal.  ____________________________________________   LABS (all labs ordered are listed, but only abnormal results are displayed)  Results for orders placed or performed during the hospital encounter of 05/22/18 (from  the past 24 hour(s))  CBC with Differential/Platelet     Status: Abnormal   Collection Time: 05/22/18 10:38 AM  Result Value Ref Range   WBC 4.6 4.0 - 10.5 K/uL   RBC 4.37 4.22 - 5.81 MIL/uL   Hemoglobin 13.9 13.0 - 17.0 g/dL   HCT 96.0 45.4 - 09.8 %   MCV 97.3 80.0 - 100.0 fL   MCH 31.8 26.0 - 34.0 pg   MCHC 32.7 30.0 - 36.0 g/dL   RDW 11.9 14.7 - 82.9 %   Platelets 167 150 - 400 K/uL   nRBC 0.0 0.0 - 0.2 %   Neutrophils Relative % 72 %   Neutro Abs 3.3 1.7 - 7.7 K/uL   Lymphocytes Relative 9 %   Lymphs Abs 0.4 (L) 0.7 - 4.0 K/uL   Monocytes Relative 16 %   Monocytes Absolute 0.7 0.1 - 1.0 K/uL   Eosinophils Relative 2 %   Eosinophils Absolute 0.1 0.0 - 0.5 K/uL   Basophils Relative 1 %   Basophils Absolute 0.0 0.0 - 0.1 K/uL   Immature Granulocytes 0 %   Abs Immature Granulocytes 0.01 0.00 - 0.07 K/uL  Comprehensive metabolic panel     Status: Abnormal   Collection Time: 05/22/18 10:38 AM  Result Value Ref Range   Sodium 138 135 - 145 mmol/L   Potassium 3.7 3.5 - 5.1 mmol/L   Chloride 103 98 - 111 mmol/L   CO2 25 22 - 32 mmol/L   Glucose, Bld 102 (H) 70 - 99 mg/dL   BUN 10 6 - 20 mg/dL   Creatinine, Ser 5.62 0.61 - 1.24 mg/dL   Calcium 8.9 8.9 - 13.0 mg/dL   Total Protein 7.1 6.5 - 8.1 g/dL   Albumin 4.2 3.5 - 5.0 g/dL   AST 26 15 - 41 U/L   ALT 18 0 - 44 U/L   Alkaline Phosphatase 79 38 - 126 U/L   Total Bilirubin 1.2 0.3 - 1.2 mg/dL   GFR calc non Af Amer >60 >60 mL/min   GFR calc Af Amer >60 >60 mL/min   Anion gap 10 5 - 15  Influenza panel by PCR (type A & B)     Status: Abnormal   Collection Time: 05/22/18 10:38 AM  Result Value Ref Range   Influenza A By PCR POSITIVE (A) NEGATIVE   Influenza B By PCR NEGATIVE NEGATIVE   ____________________________________________ ____________________________________________  RADIOLOGY  I personally reviewed all radiographic images ordered to evaluate for the above acute complaints and reviewed radiology reports and  findings.  These findings were personally discussed with the patient.  Please see medical record for radiology report.  ____________________________________________   PROCEDURES  Procedure(s) performed:  Procedures    Critical Care performed: no ____________________________________________   INITIAL IMPRESSION / ASSESSMENT AND PLAN / ED COURSE  Pertinent labs & imaging results that were available during my care of the patient were reviewed by me and considered in my medical decision making (see chart for details).   DDX: flu, ili, pna, migraine, sinusitis, doubt meningitis or encephalitis  Richard Parks is a 42 y.o. who presents to the ED with symptoms as described above.  Patient nontoxic-appearing.  Does have low-grade temperature.  No meningeal signs.  Does endorse nausea vomiting and congestion.  Probable viral syndrome.  We will give IV fluids as well as migraine cocktail  Clinical Course as of May 22 1341  Fri May 22, 2018  1134 She resting comfortably.   [PR]  1206 Patient feels much improved.  He did test positive for influenza A but I do not feel patient would benefit from Tamiflu given duration of symptoms.  Remains afebrile at this time.  Heema dynamically stable.  Requesting something to drink.   [PR]    Clinical Course User Index [PR] Willy Eddy, MD     As part of my medical decision making, I reviewed the following data within the electronic MEDICAL RECORD NUMBER Nursing notes reviewed and incorporated, Labs reviewed, notes from prior ED visits.   ____________________________________________   FINAL CLINICAL IMPRESSION(S) / ED DIAGNOSES  Final diagnoses:  Acute nonintractable headache, unspecified headache type  Influenza A      NEW MEDICATIONS STARTED DURING THIS VISIT:  Discharge Medication List as of 05/22/2018 12:17 PM    START taking these medications   Details  promethazine (PHENERGAN) 12.5 MG tablet Take 1 tablet (12.5 mg total) by  mouth every 6 (six) hours as needed for nausea or vomiting., Starting Fri 05/22/2018, Print         Note:  This document was prepared using Dragon voice recognition software and may include unintentional dictation errors.    Willy Eddy, MD 05/22/18 [phone]

## 2018-05-22 NOTE — ED Triage Notes (Signed)
Forehead headache x 1 week. Denies aura. Has taken ibuprofen. Wife states he was shivering last night.

## 2018-05-22 NOTE — ED Notes (Signed)
Patient verbalized understanding of discharge instructions, no questions. Patient ambulated out of ED with steady gait in no distress.  

## 2018-05-22 NOTE — Discharge Instructions (Addendum)

## 2018-05-22 NOTE — ED Notes (Signed)
Offered toradol, patient declined stated he want to eat. Informed patient that there are currently not any boxed lunches.

## 2018-08-19 IMAGING — CR DG FOOT COMPLETE 3+V*R*
1 series · 3 of 3 positions shown · non-contrast
Comparison: None.

CLINICAL DATA: Crush injury.  Distal foot pain.

EXAM:
RIGHT FOOT COMPLETE - 3+ VIEW

[Series 1: dg foot complete right · 0.14mm/px · 3 of 3 slices shown]
[im 1/3]
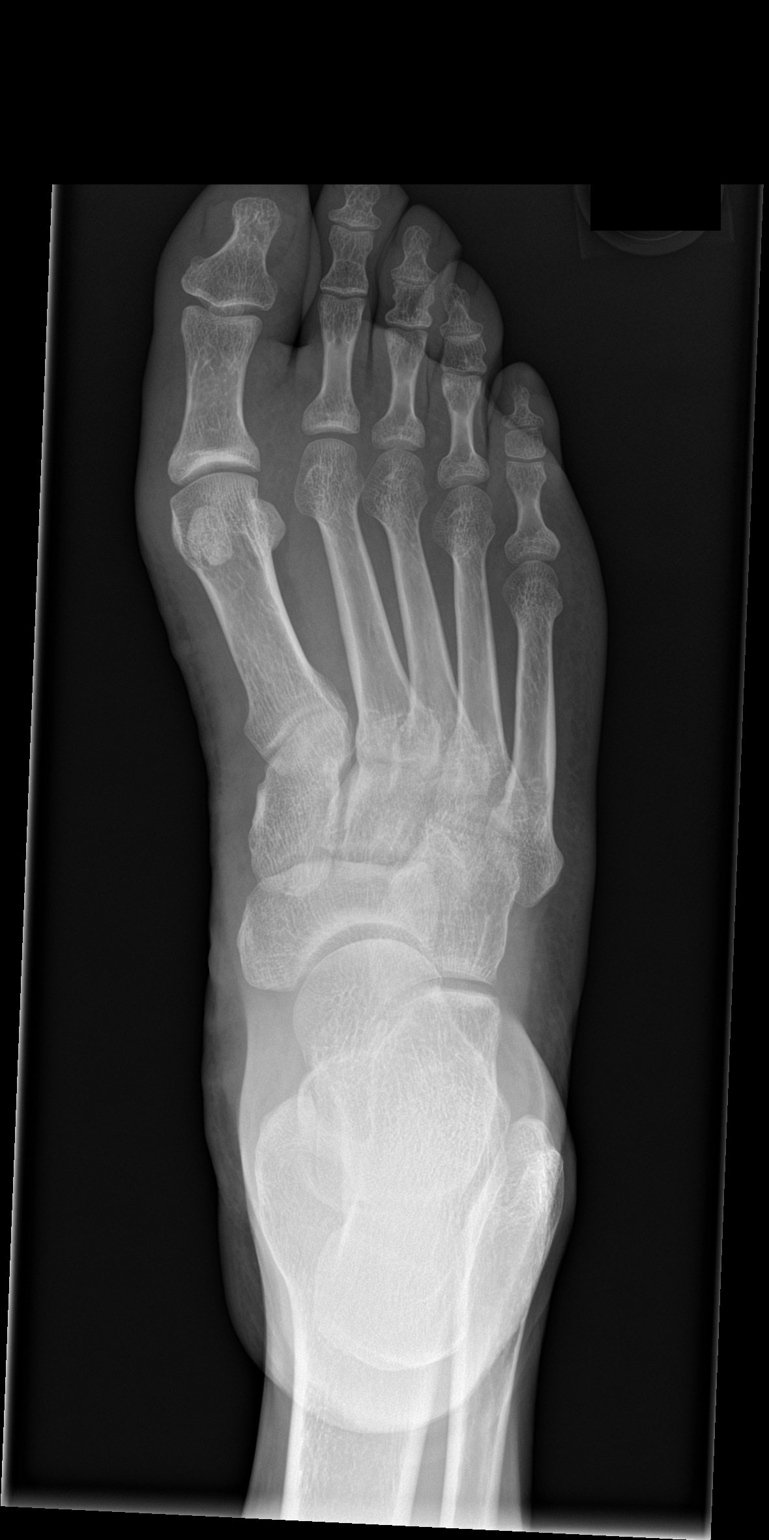
[im 2/3]
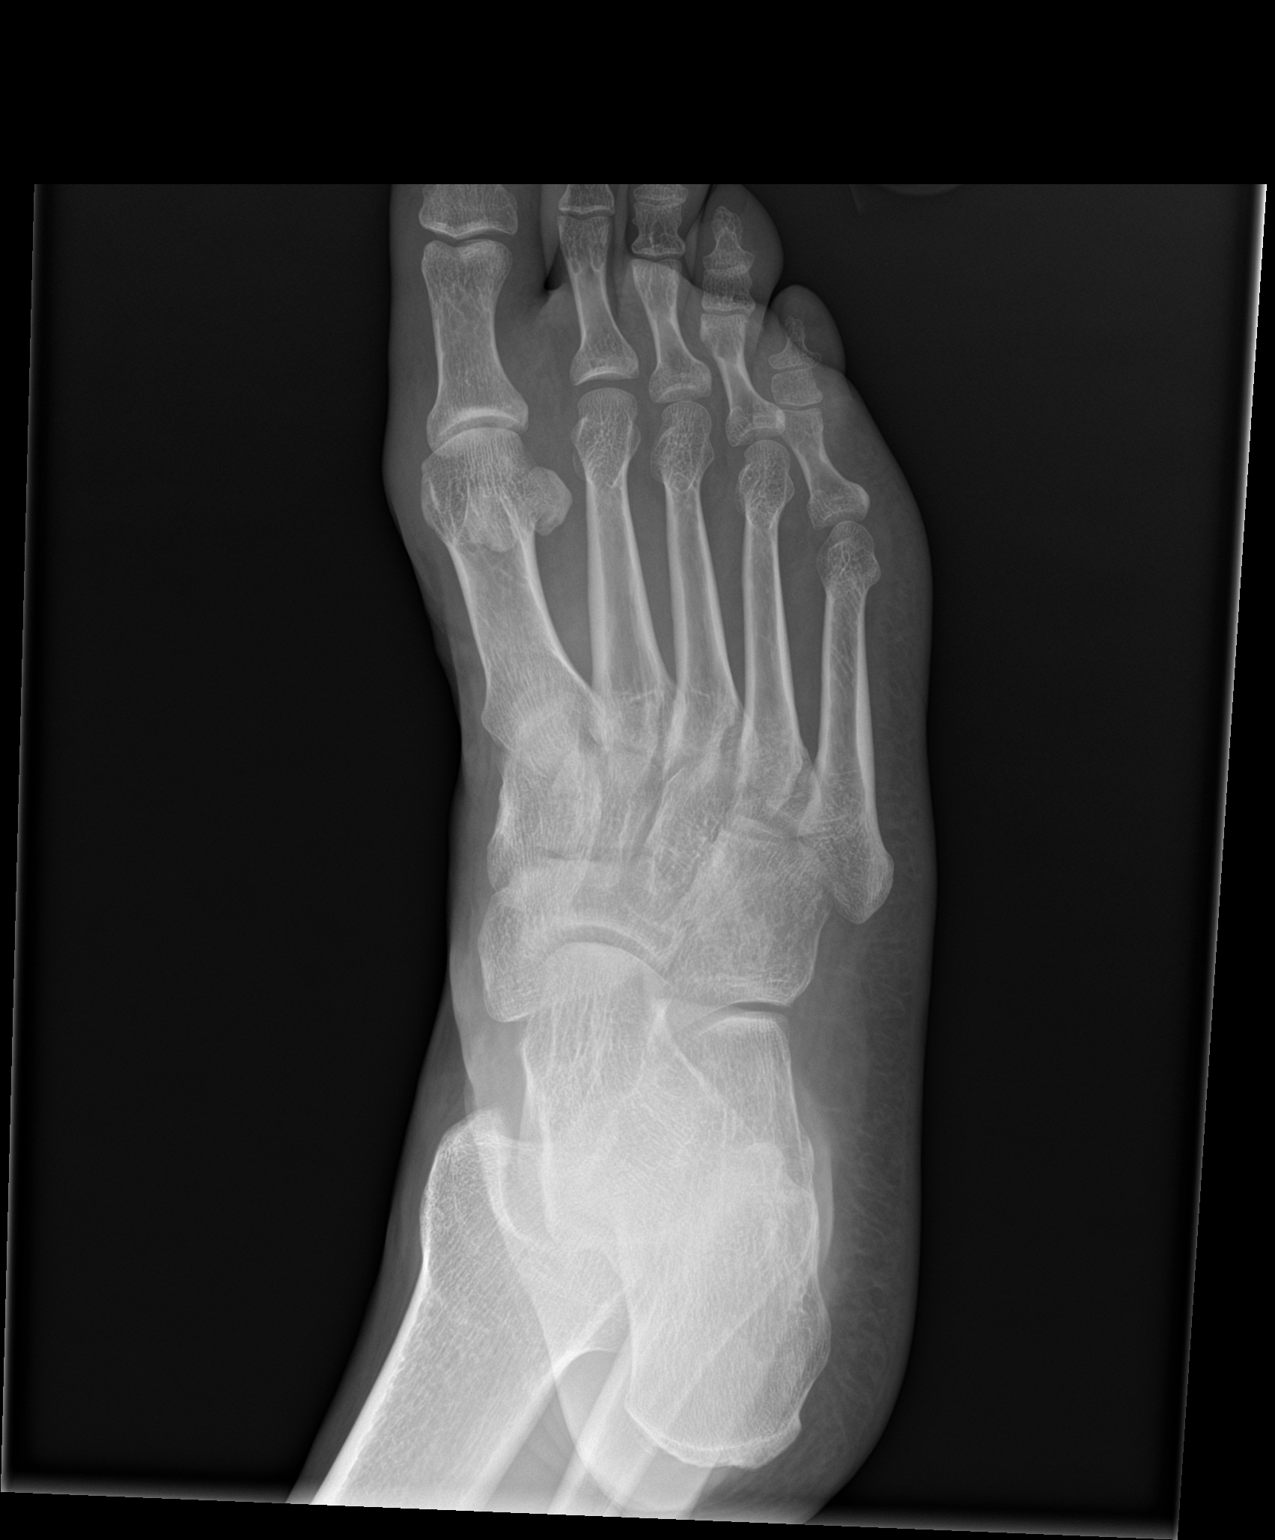
[im 3/3]
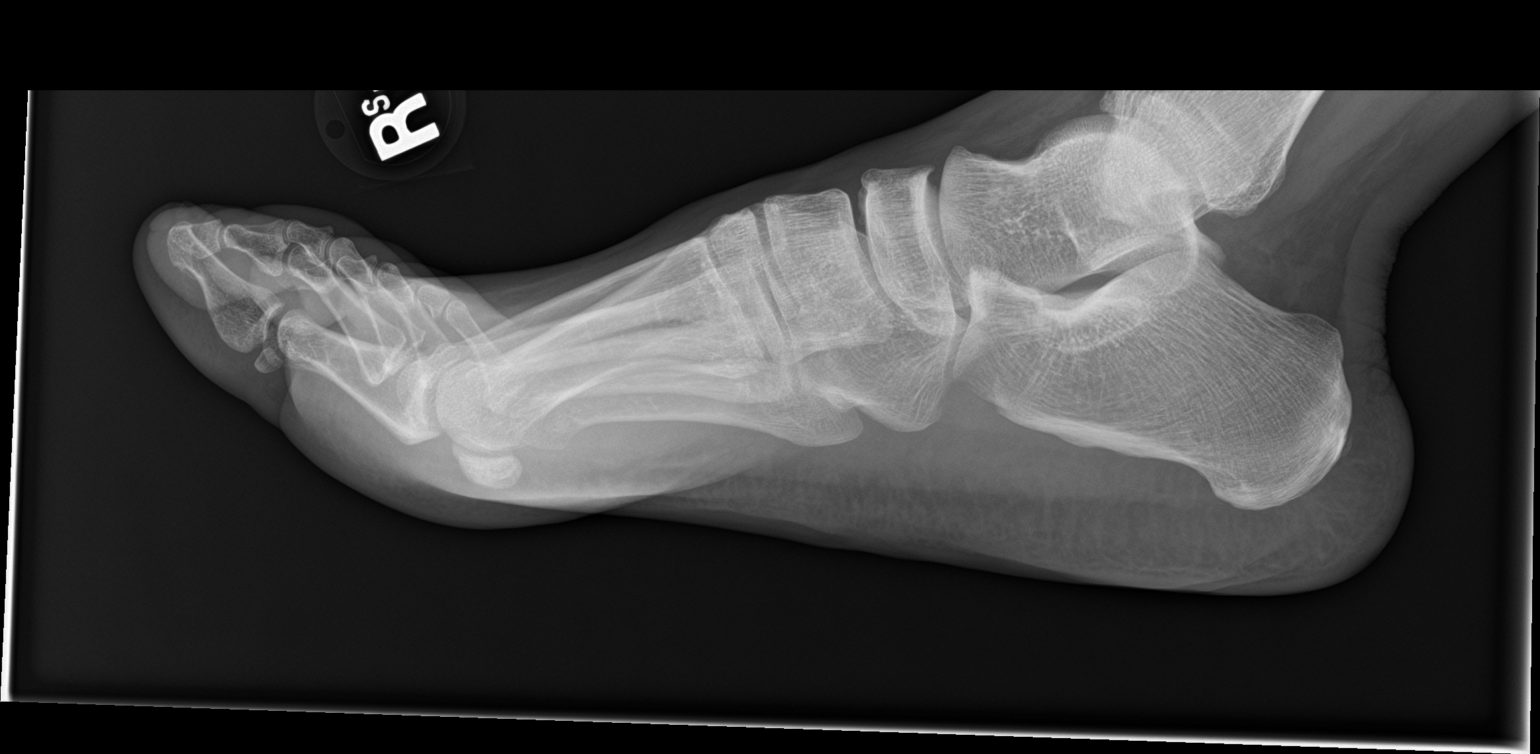

[3 of 3 positions shown; findings below may reference images not displayed]

FINDINGS: There is no evidence of fracture or dislocation. There is no
evidence of arthropathy or other focal bone abnormality. Soft
tissues are unremarkable.
IMPRESSION: Negative.

## 2019-11-13 IMAGING — CR DG CHEST 2V
1 series · 2 of 2 positions shown · non-contrast
Comparison: None.

CLINICAL DATA: Fever and congestion

EXAM:
CHEST - 2 VIEW

[Series 1: dg chest 2 view · 0.14mm/px · 2 of 2 slices shown]
[im 1/2]
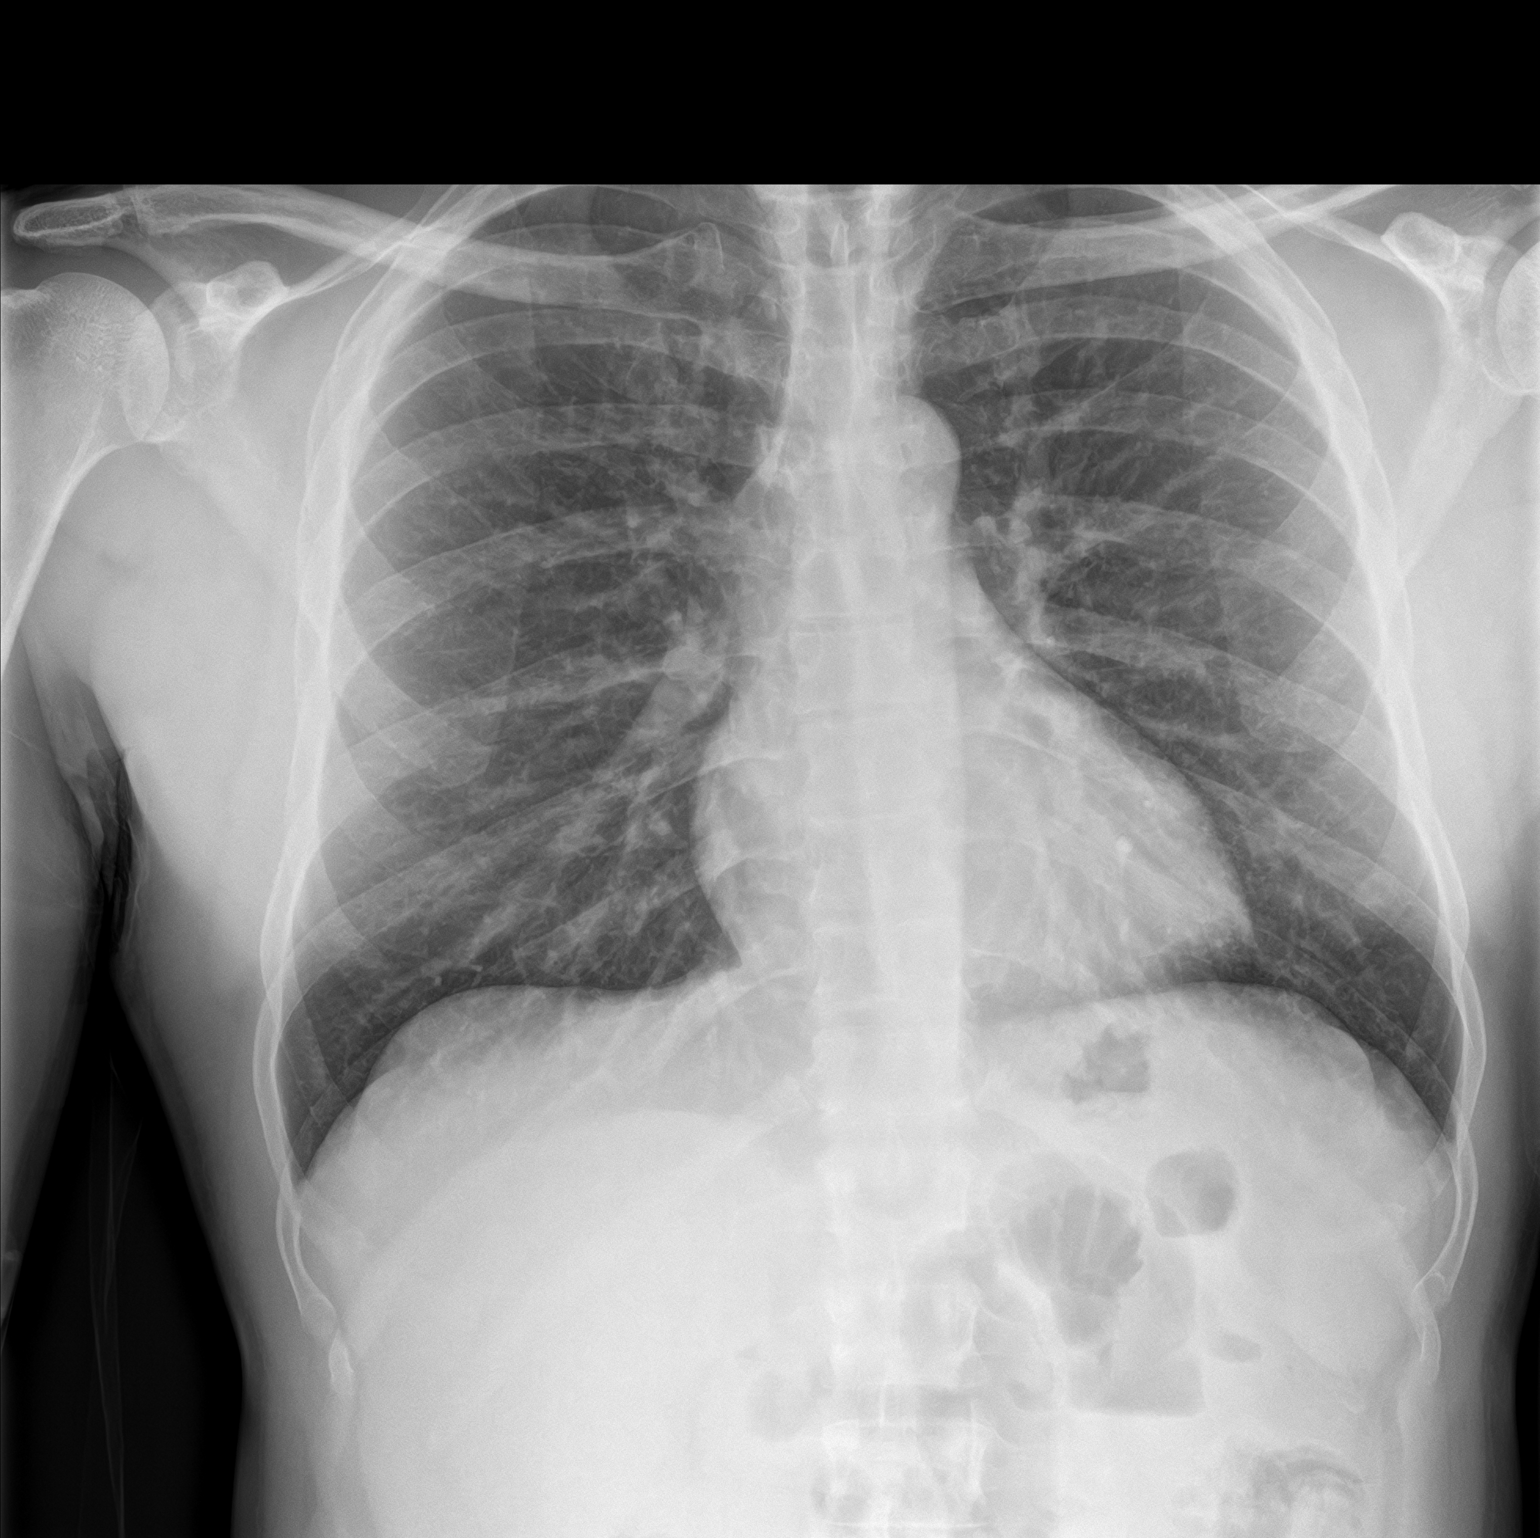
[im 2/2]
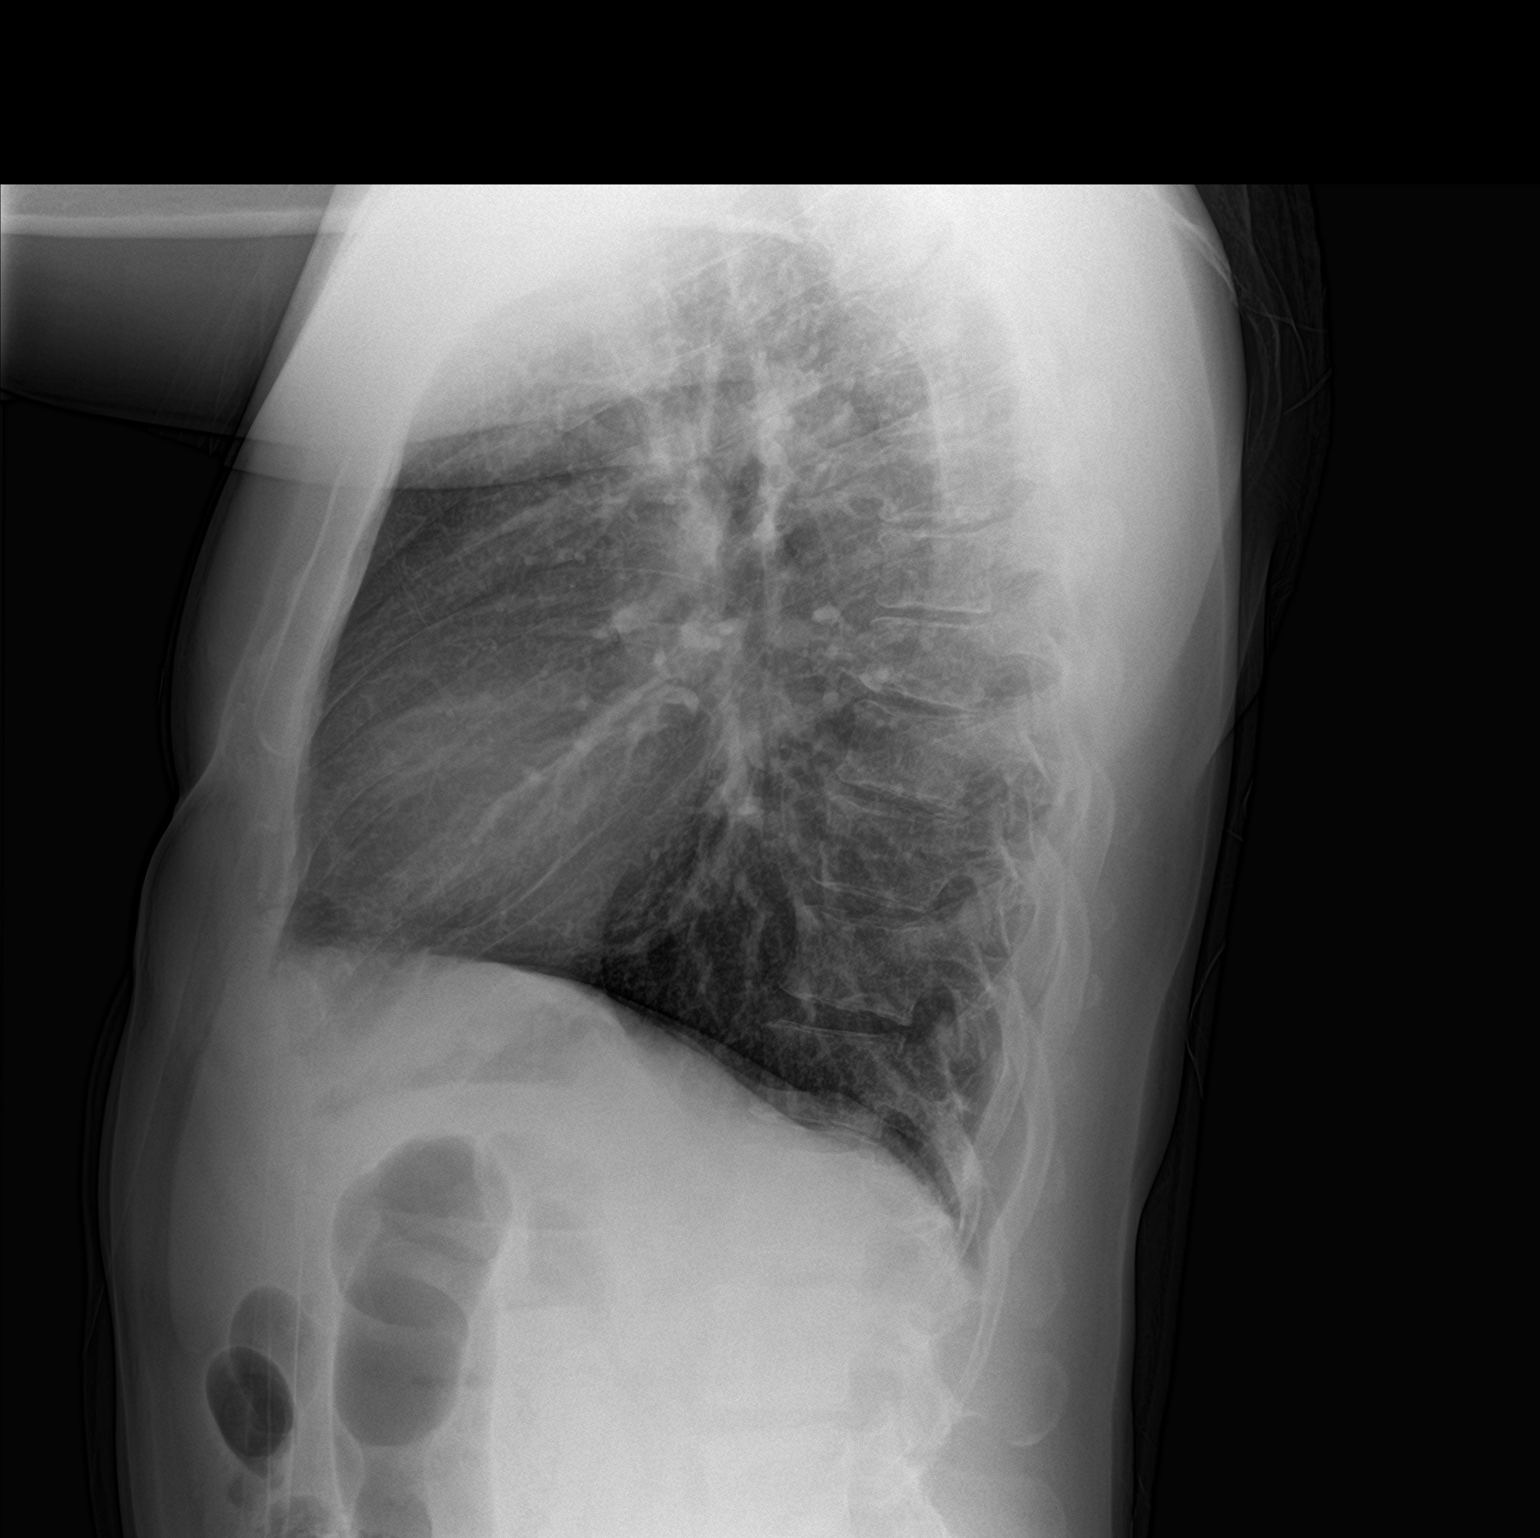

[2 of 2 positions shown; findings below may reference images not displayed]

FINDINGS: Lungs are clear. The heart size and pulmonary vascularity are
normal. No adenopathy. No bone lesions.
IMPRESSION: No edema or consolidation.

## 2020-04-22 ENCOUNTER — Emergency Department
Admission: EM | Admit: 2020-04-22 | Discharge: 2020-04-22 | Disposition: A | Discharge status: Home / Self Care | Payer: Self-pay | Attending: Emergency Medicine | Admitting: Emergency Medicine

## 2020-04-22 ENCOUNTER — Other Ambulatory Visit: Payer: Self-pay

## 2020-04-22 ENCOUNTER — Encounter: Payer: Self-pay | Admitting: Emergency Medicine

## 2020-04-22 DIAGNOSIS — F17200 Nicotine dependence, unspecified, uncomplicated: Secondary | ICD-10-CM | POA: Insufficient documentation

## 2020-04-22 DIAGNOSIS — R519 Headache, unspecified: Secondary | ICD-10-CM | POA: Insufficient documentation

## 2020-04-22 MED ORDER — IBUPROFEN 600 MG PO TABS: 600.0000 mg | ORAL_TABLET | Freq: Four times a day (QID) | ORAL | 0 refills | Status: DC | PRN

## 2020-04-22 NOTE — ED Provider Notes (Signed)
[facility] Emergency Department Provider Note  ____________________________________________  Time seen: Approximately 6:15 PM  I have reviewed the triage vital signs and the nursing notes.   HISTORY  Chief Complaint No chief complaint on file.    HPI Richard Parks is a 44 y.o. male that presents to the emergency department for a work note to excuse his absence after missing work today due to a headache. Patient has had intermittent headaches for years. Headache feels the same as his previous headaches. Headache started yesterday on the right side of his head. As the day went on it progressed to a band around his head. His headache improved today and he denies any headache currently. Patient states that he missed work yesterday and today due to the headache. His job told him that he needs a note to excuse him. He took Excedrin last night which helped. He has not taken any medications today. Patient has not seen primary care for his headaches. He would like resources for how to establish with primary care.   No past medical history on file.  There are no problems to display for this patient.   No past surgical history on file.  Prior to Admission medications   Medication Sig Start Date End Date Taking? Authorizing Provider  HYDROcodone-acetaminophen (NORCO) 5-325 MG tablet Take 1 tablet by mouth every 6 (six) hours as needed for moderate pain. Patient not taking: Reported on 05/22/2018 02/08/18   Irean Hong, MD  ibuprofen (ADVIL) 600 MG tablet Take 1 tablet (600 mg total) by mouth every 6 (six) hours as needed. 04/22/20   Enid Derry, PA-C  ondansetron (ZOFRAN ODT) 4 MG disintegrating tablet Take 1 tablet (4 mg total) by mouth every 8 (eight) hours as needed for nausea or vomiting. Patient not taking: Reported on 05/22/2018 02/08/18   Irean Hong, MD  polyethylene glycol [facility] / Ethelene Hal) packet Take 17 g by mouth daily. Mix one tablespoon with 8oz of  your favorite juice or water every day until you are having soft formed stools. Then start taking once daily if you didn't have a stool the day before. Patient not taking: Reported on 05/22/2018 10/09/16   Willy Eddy, MD  promethazine (PHENERGAN) 12.5 MG tablet Take 1 tablet (12.5 mg total) by mouth every 6 (six) hours as needed for nausea or vomiting. 05/22/18   Willy Eddy, MD    Allergies Patient has no known allergies.  No family history on file.  Social History Social History   Tobacco Use  . Smoking status: Current Every Day Smoker  . Smokeless tobacco: Never Used  Vaping Use  . Vaping Use: Never used  Substance Use Topics  . Alcohol use: Yes  . Drug use: Yes    Types: Marijuana    Comment: "everyday"     Review of Systems  Constitutional: No fever/chills Eyes: No visual changes. No discharge. ENT: Negative for congestion and rhinorrhea. Cardiovascular: No chest pain. Respiratory: Negative for cough. No SOB. Gastrointestinal: No abdominal pain.  No nausea, no vomiting.  No diarrhea.  No constipation. Musculoskeletal: Negative for musculoskeletal pain. Skin: Negative for rash, abrasions, lacerations, ecchymosis. Neurological: Positive for headaches.   ____________________________________________   PHYSICAL EXAM:  VITAL SIGNS: ED Triage Vitals  Enc Vitals Group     BP 04/22/20 1642 (!) 157/92     Pulse Rate 04/22/20 1642 82     Resp 04/22/20 1642 18     Temp 04/22/20 1642 98 F (36.7 C)  Temp Source 04/22/20 1642 Oral     SpO2 04/22/20 1642 99 %     Weight 04/22/20 1642 176 lb (79.8 kg)     Height 04/22/20 1642 5\' 9"  (1.753 m)     Head Circumference --      Peak Flow --      Pain Score 04/22/20 1709 6     Pain Loc --      Pain Edu? --      Excl. in GC? --      Constitutional: Alert and oriented. Well appearing and in no acute distress. Eyes: Conjunctivae are normal. PERRL. EOMI. No discharge. Head: Atraumatic. ENT: No frontal and  maxillary sinus tenderness.      Ears: Tympanic membranes pearly gray with good landmarks. No discharge.      Nose: No congestion/rhinnorhea.      Mouth/Throat: Mucous membranes are moist. Oropharynx non-erythematous. Tonsils not enlarged. No exudates. Uvula midline. Neck: No stridor.   Hematological/Lymphatic/Immunilogical: No cervical lymphadenopathy. Cardiovascular: Normal rate, regular rhythm.  Good peripheral circulation. Respiratory: Normal respiratory effort without tachypnea or retractions. Lungs CTAB. Good air entry to the bases with no decreased or absent breath sounds. Gastrointestinal: Bowel sounds 4 quadrants. Soft and nontender to palpation. No guarding or rigidity. No palpable masses. No distention. Musculoskeletal: Full range of motion to all extremities. No gross deformities appreciated. Neurologic:  Normal speech and language. No gross focal neurologic deficits are appreciated.  Skin:  Skin is warm, dry and intact. No rash noted. Psychiatric: Mood and affect are normal. Speech and behavior are normal. Patient exhibits appropriate insight and judgement.   ____________________________________________   LABS (all labs ordered are listed, but only abnormal results are displayed)  Labs Reviewed - No data to display ____________________________________________  EKG   ____________________________________________  RADIOLOGY   No results found.  ____________________________________________    PROCEDURES  Procedure(s) performed:    Procedures    Medications - No data to display   ____________________________________________   INITIAL IMPRESSION / ASSESSMENT AND PLAN / ED COURSE  Pertinent labs & imaging results that were available during my care of the patient were reviewed by me and considered in my medical decision making (see chart for details).  Review of the [place] CSRS was performed in accordance of the [employer] prior to dispensing any controlled  drugs.   Patient's diagnosis is consistent with headache. Vital signs and exam are reassuring. Patient denies headache currently. He would not like any medication or anything else done today. He needs a note for work today. Patient was encouraged to follow-up with primary care regarding his headaches. Resources were given. Patient feels comfortable going home. Patient will be discharged home with prescriptions for ibuprofen. Patient is to follow up with PCP as needed or otherwise directed. Patient is given ED precautions to return to the ED for any worsening or new symptoms.  Marquise D Klemann was evaluated in Emergency Department on 04/22/2020 for the symptoms described in the history of present illness. He was evaluated in the context of the global COVID-19 pandemic, which necessitated consideration that the patient might be at risk for infection with the SARS-CoV-2 virus that causes COVID-19. Institutional protocols and algorithms that pertain to the evaluation of patients at risk for COVID-19 are in a state of rapid change based on information released by regulatory bodies including the CDC and federal and state organizations. These policies and algorithms were followed during the patient's care in the ED.   ____________________________________________  FINAL CLINICAL IMPRESSION(S) /  ED DIAGNOSES  Final diagnoses:  Acute nonintractable headache, unspecified headache type      NEW MEDICATIONS STARTED DURING THIS VISIT:  ED Discharge Orders         Ordered    ibuprofen (ADVIL) 600 MG tablet  Every 6 hours PRN        04/22/20 1817              This chart was dictated using voice recognition software/Dragon. Despite best efforts to proofread, errors can occur which can change the meaning. Any change was purely unintentional.    Enid Derry, PA-C 04/22/20 1837    Chesley Noon, MD 04/22/20 [phone]

## 2020-04-22 NOTE — ED Triage Notes (Addendum)
Pt to Ed via POV. Pt st having intermittent HA for "years" but severe  throbbing headaches within the last 2 days. St missing work due to severe HA.  Pt st "attempting to read but words are blurry when I get headaches" " I have been having this for a while, years. can't read small letters".     Pt denies recent illness/ fall/ head trauma.  Pt st a year ago  falling on concrete and hit his head. Denies LOC.   Pt speaking in clear and complete sentences. Equal/BI grip strength. NAD noted by this RN.   Pt ambulatory to triage with a steady gait.

## 2020-10-05 ENCOUNTER — Other Ambulatory Visit: Payer: Self-pay

## 2020-10-05 ENCOUNTER — Emergency Department
Admission: EM | Admit: 2020-10-05 | Discharge: 2020-10-05 | Disposition: A | Discharge status: Home / Self Care | Payer: Self-pay | Attending: Physician Assistant | Admitting: Physician Assistant

## 2020-10-05 DIAGNOSIS — F17200 Nicotine dependence, unspecified, uncomplicated: Secondary | ICD-10-CM | POA: Insufficient documentation

## 2020-10-05 DIAGNOSIS — R112 Nausea with vomiting, unspecified: Secondary | ICD-10-CM | POA: Insufficient documentation

## 2020-10-05 DIAGNOSIS — R1110 Vomiting, unspecified: Secondary | ICD-10-CM

## 2020-10-05 LAB — BASIC METABOLIC PANEL
Anion gap: 9 (ref 5–15)
BUN: 18 mg/dL (ref 6–20)
CO2: 24 mmol/L (ref 22–32)
Calcium: 9.3 mg/dL (ref 8.9–10.3)
Chloride: 105 mmol/L (ref 98–111)
Creatinine, Ser: 1.15 mg/dL (ref 0.61–1.24)
GFR, Estimated: >60 mL/min (ref >60)
Glucose, Bld: 113 mg/dL — ABNORMAL HIGH (ref 70–99)
Potassium: 4 mmol/L (ref 3.5–5.1)
Sodium: 138 mmol/L (ref 135–145)

## 2020-10-05 LAB — CBC WITH DIFFERENTIAL/PLATELET
Abs Immature Granulocytes: 0.01 10*3/uL (ref 0.00–0.07)
Basophils Absolute: 0.1 10*3/uL (ref 0.0–0.1)
Basophils Relative: 1 %
Eosinophils Absolute: 0.3 10*3/uL (ref 0.0–0.5)
Eosinophils Relative: 5 %
HCT: 43.9 % (ref 39.0–52.0)
Hemoglobin: 14.9 g/dL (ref 13.0–17.0)
Immature Granulocytes: 0 %
Lymphocytes Relative: 32 %
Lymphs Abs: 1.6 10*3/uL (ref 0.7–4.0)
MCH: 32.3 pg (ref 26.0–34.0)
MCHC: 33.9 g/dL (ref 30.0–36.0)
MCV: 95.2 fL (ref 80.0–100.0)
Monocytes Absolute: 0.3 10*3/uL (ref 0.1–1.0)
Monocytes Relative: 6 %
Neutro Abs: 2.7 10*3/uL (ref 1.7–7.7)
Neutrophils Relative %: 56 %
Platelets: 215 10*3/uL (ref 150–400)
RBC: 4.61 MIL/uL (ref 4.22–5.81)
RDW: 11.8 % (ref 11.5–15.5)
WBC: 4.8 10*3/uL (ref 4.0–10.5)
nRBC: 0 % (ref 0.0–0.2)

## 2020-10-05 LAB — LIPASE, BLOOD: Lipase: 33 U/L (ref 11–51)

## 2020-10-05 MED ORDER — ONDANSETRON 8 MG PO TBDP
8.0000 mg | ORAL_TABLET | Freq: Once | ORAL | Status: AC
  Administered 2020-10-05: 8 mg via ORAL
  Filled 2020-10-05: qty 1

## 2020-10-05 MED ORDER — ONDANSETRON HCL 4 MG/2ML IJ SOLN
4.0000 mg | Freq: Once | INTRAMUSCULAR | Status: DC
  Filled 2020-10-05: qty 2

## 2020-10-05 MED ORDER — ONDANSETRON HCL 8 MG PO TABS: 8.0000 mg | ORAL_TABLET | Freq: Three times a day (TID) | ORAL | 0 refills | Status: DC | PRN

## 2020-10-05 NOTE — ED Notes (Signed)
See triage note  Presents with some vomiting for the past couple of days  Vomited times 2 yesterday and again this am   Unsure about fever but is currently afebrile

## 2020-10-05 NOTE — Discharge Instructions (Signed)
No acute findings on lab results.  Follow discharge care instruction take medication as directed.

## 2020-10-05 NOTE — ED Provider Notes (Signed)
[facility] Emergency Department Provider Note   ____________________________________________   Event Date/Time   First MD Initiated Contact with Patient 10/05/20 1000     (approximate)  I have reviewed the triage vital signs and the nursing notes.   HISTORY  Chief Complaint Fever and Emesis    HPI Richard Parks is a 45 y.o. male patient complain of nausea and vomiting started yesterday at work.  Patient state he got sick and vomited 4 times was sent home.  Patient event work today and had another episode of vomiting and was sent to this facility for clearance before returning to work.  Patient denies any provocative incident for complaint.         History reviewed. No pertinent past medical history.  There are no problems to display for this patient.   History reviewed. No pertinent surgical history.  Prior to Admission medications   Medication Sig Start Date End Date Taking? Authorizing Provider  ondansetron (ZOFRAN) 8 MG tablet Take 1 tablet (8 mg total) by mouth every 8 (eight) hours as needed for nausea or vomiting. 10/05/20  Yes Joni Reining, PA-C  ibuprofen (ADVIL) 600 MG tablet Take 1 tablet (600 mg total) by mouth every 6 (six) hours as needed. 04/22/20   Enid Derry, PA-C  promethazine (PHENERGAN) 12.5 MG tablet Take 1 tablet (12.5 mg total) by mouth every 6 (six) hours as needed for nausea or vomiting. 05/22/18   Willy Eddy, MD    Allergies Patient has no known allergies.  No family history on file.  Social History Social History   Tobacco Use  . Smoking status: Current Every Day Smoker  . Smokeless tobacco: Never Used  Vaping Use  . Vaping Use: Never used  Substance Use Topics  . Alcohol use: Yes  . Drug use: Yes    Types: Marijuana    Comment: "everyday"    Review of Systems Constitutional: No fever/chills Eyes: No visual changes. ENT: No sore throat. Cardiovascular: Denies chest pain. Respiratory:  Denies shortness of breath. Gastrointestinal: No abdominal pain.  Nausea and vomiting, no diarrhea.  No constipation. Genitourinary: Negative for dysuria. Musculoskeletal: Negative for back pain. Skin: Negative for rash. Neurological: Negative for headaches, focal weakness or numbness.   ____________________________________________   PHYSICAL EXAM:  VITAL SIGNS: ED Triage Vitals  Enc Vitals Group     BP 10/05/20 0936 (!) 134/91     Pulse Rate 10/05/20 0936 94     Resp 10/05/20 0936 18     Temp 10/05/20 0936 98.3 F (36.8 C)     Temp Source 10/05/20 0936 Oral     SpO2 10/05/20 0936 92 %     Weight 10/05/20 0959 175 lb 14.8 oz (79.8 kg)     Height 10/05/20 0959 5\' 9"  (1.753 m)     Head Circumference --      Peak Flow --      Pain Score 10/05/20 0939 4     Pain Loc --      Pain Edu? --      Excl. in GC? --    Constitutional: Alert and oriented. Well appearing and in no acute distress. Mouth/Throat: Mucous membranes are moist.  Oropharynx non-erythematous. Neck: No stridor.  No cervical spine tenderness to palpation. Hematological/Lymphatic/Immunilogical: No cervical lymphadenopathy. Cardiovascular: Normal rate, regular rhythm. Grossly normal heart sounds.  Good peripheral circulation. Respiratory: Normal respiratory effort.  No retractions. Lungs CTAB. Gastrointestinal: Soft and nontender. No distention. No abdominal bruits. No CVA  tenderness. Genitourinary: Deferred Neurologic:  Normal speech and language. No gross focal neurologic deficits are appreciated. No gait instability. Skin:  Skin is warm, dry and intact. No rash noted. Psychiatric: Mood and affect are normal. Speech and behavior are normal.  ____________________________________________   LABS (all labs ordered are listed, but only abnormal results are displayed)  Labs Reviewed  BASIC METABOLIC PANEL - Abnormal; Notable for the following components:      Result Value   Glucose, Bld 113 (*)    All other  components within normal limits  LIPASE, BLOOD  CBC WITH DIFFERENTIAL/PLATELET   ____________________________________________  EKG   ____________________________________________  RADIOLOGY I, Joni Reining, personally viewed and evaluated these images (plain radiographs) as part of my medical decision making, as well as reviewing the written report by the radiologist.  ED MD interpretation:    Official radiology report(s): No results found.  ____________________________________________   PROCEDURES  Procedure(s) performed (including Critical Care):  Procedures   ____________________________________________   INITIAL IMPRESSION / ASSESSMENT AND PLAN / ED COURSE  As part of my medical decision making, I reviewed the following data within the electronic MEDICAL RECORD NUMBER         Patient presents with 2 days of nausea and vomiting.  Discussed no acute findings on lab results with patient.  Patient complaint and physical exam consistent with nausea.  Patient responded well to OTC Zofran while waiting for lab results.  Patient given discharge care instructions advised to [facility].     ____________________________________________   FINAL CLINICAL IMPRESSION(S) / ED DIAGNOSES  Final diagnoses:  Non-intractable vomiting, presence of nausea not specified, unspecified vomiting type     ED Discharge Orders         Ordered    ondansetron (ZOFRAN) 8 MG tablet  Every 8 hours PRN        10/05/20 1117          *Please note:  Richard Parks was evaluated in Emergency Department on 10/05/2020 for the symptoms described in the history of present illness. He was evaluated in the context of the global COVID-19 pandemic, which necessitated consideration that the patient might be at risk for infection with the SARS-CoV-2 virus that causes COVID-19. Institutional protocols and algorithms that pertain to the evaluation of patients at risk for COVID-19 are  in a state of rapid change based on information released by regulatory bodies including the CDC and federal and state organizations. These policies and algorithms were followed during the patient's care in the ED.  Some ED evaluations and interventions may be delayed as a result of limited staffing during and the pandemic.*   Note:  This document was prepared using Dragon voice recognition software and may include unintentional dictation errors.    Joni Reining, PA-C 10/05/20 1121    Willy Eddy, MD 10/05/20 1343

## 2020-10-05 NOTE — ED Triage Notes (Signed)
Pt comes with c/o vomiting and possible fever since yesterday. Pt states he was at work and got sick and vomited. Pt states again today he had another episode. Pt states he felt hot.

## 2020-12-27 ENCOUNTER — Encounter: Payer: Self-pay | Admitting: Emergency Medicine

## 2020-12-27 ENCOUNTER — Other Ambulatory Visit: Payer: Self-pay

## 2020-12-27 ENCOUNTER — Emergency Department
Admission: EM | Admit: 2020-12-27 | Discharge: 2020-12-27 | Disposition: A | Discharge status: Home / Self Care | Payer: Self-pay | Attending: Emergency Medicine | Admitting: Emergency Medicine

## 2020-12-27 DIAGNOSIS — F17200 Nicotine dependence, unspecified, uncomplicated: Secondary | ICD-10-CM | POA: Insufficient documentation

## 2020-12-27 DIAGNOSIS — R1010 Upper abdominal pain, unspecified: Secondary | ICD-10-CM | POA: Insufficient documentation

## 2020-12-27 LAB — CBC WITH DIFFERENTIAL/PLATELET
Abs Immature Granulocytes: 0.03 10*3/uL (ref 0.00–0.07)
Basophils Absolute: 0 10*3/uL (ref 0.0–0.1)
Basophils Relative: 1 %
Eosinophils Absolute: 0.2 10*3/uL (ref 0.0–0.5)
Eosinophils Relative: 3 %
HCT: 40.8 % (ref 39.0–52.0)
Hemoglobin: 13.8 g/dL (ref 13.0–17.0)
Immature Granulocytes: 1 %
Lymphocytes Relative: 35 %
Lymphs Abs: 2.1 10*3/uL (ref 0.7–4.0)
MCH: 33.1 pg (ref 26.0–34.0)
MCHC: 33.8 g/dL (ref 30.0–36.0)
MCV: 97.8 fL (ref 80.0–100.0)
Monocytes Absolute: 0.5 10*3/uL (ref 0.1–1.0)
Monocytes Relative: 7 %
Neutro Abs: 3.3 10*3/uL (ref 1.7–7.7)
Neutrophils Relative %: 53 %
Platelets: 180 10*3/uL (ref 150–400)
RBC: 4.17 MIL/uL — ABNORMAL LOW (ref 4.22–5.81)
RDW: 11.6 % (ref 11.5–15.5)
WBC: 6.1 10*3/uL (ref 4.0–10.5)
nRBC: 0 % (ref 0.0–0.2)

## 2020-12-27 LAB — URINALYSIS, COMPLETE (UACMP) WITH MICROSCOPIC
Bacteria, UA: NONE SEEN
Bilirubin Urine: NEGATIVE
Glucose, UA: NEGATIVE mg/dL
Hgb urine dipstick: NEGATIVE
Ketones, ur: NEGATIVE mg/dL
Nitrite: NEGATIVE
Protein, ur: NEGATIVE mg/dL
Specific Gravity, Urine: 1.029 (ref 1.005–1.030)
Squamous Epithelial / HPF: NONE SEEN (ref 0–5)
pH: 6 (ref 5.0–8.0)

## 2020-12-27 LAB — COMPREHENSIVE METABOLIC PANEL
ALT: 20 U/L (ref 0–44)
AST: 22 U/L (ref 15–41)
Albumin: 3.8 g/dL (ref 3.5–5.0)
Alkaline Phosphatase: 82 U/L (ref 38–126)
Anion gap: 8 (ref 5–15)
BUN: 13 mg/dL (ref 6–20)
CO2: 26 mmol/L (ref 22–32)
Calcium: 8.8 mg/dL — ABNORMAL LOW (ref 8.9–10.3)
Chloride: 106 mmol/L (ref 98–111)
Creatinine, Ser: 0.94 mg/dL (ref 0.61–1.24)
GFR, Estimated: >60 mL/min (ref >60)
Glucose, Bld: 104 mg/dL — ABNORMAL HIGH (ref 70–99)
Potassium: 3.5 mmol/L (ref 3.5–5.1)
Sodium: 140 mmol/L (ref 135–145)
Total Bilirubin: 1.2 mg/dL (ref 0.3–1.2)
Total Protein: 6.9 g/dL (ref 6.5–8.1)

## 2020-12-27 LAB — LIPASE, BLOOD: Lipase: 29 U/L (ref 11–51)

## 2020-12-27 MED ORDER — PANTOPRAZOLE SODIUM 40 MG PO TBEC: 40.0000 mg | DELAYED_RELEASE_TABLET | Freq: Every day | ORAL | 1 refills | Status: AC

## 2020-12-27 MED ORDER — PYLERA 140-125-125 MG PO CAPS: 3.0000 | ORAL_CAPSULE | Freq: Three times a day (TID) | ORAL | 0 refills | Status: AC

## 2020-12-27 NOTE — ED Provider Notes (Addendum)
[facility] Emergency Department Provider Note   ____________________________________________   Event Date/Time   First MD Initiated Contact with Patient 12/27/20 [phone]     (approximate)  I have reviewed the triage vital signs and the nursing notes.   HISTORY  Chief Complaint Abdominal Pain   HPI Richard Parks is a 45 y.o. male who reports burning mid upper abdominal pain for the last week or so it is waxed and waned for a day or 2 but now has come on constantly.  He says it is exactly the same as he has had intermittently since middle school.  He was in prison and diagnosed with H. pylori and treated for that and the pain went away.  Previous treatment attempts with Zantac etc. had been unsuccessful.  He thinks his H. pylori may have come back.  He is not having nausea or vomiting.  The pain is not crampy.  It is reproducible by palpation and is moderate in nature.  Nothing else seems to make it better or worse.  Is not having any fever.        History reviewed. No pertinent past medical history.  There are no problems to display for this patient.   History reviewed. No pertinent surgical history.  Prior to Admission medications   Medication Sig Start Date End Date Taking? Authorizing Provider  pantoprazole (PROTONIX) 40 MG tablet Take 1 tablet (40 mg total) by mouth daily. 12/27/20 12/27/21 Yes Arnaldo Natal, MD  bismuth-metronidazole-tetracycline [facility]) [phone] MG capsule Take 3 capsules by mouth 4 (four) times daily -  before meals and at bedtime. 12/27/20   Arnaldo Natal, MD  ibuprofen (ADVIL) 600 MG tablet Take 1 tablet (600 mg total) by mouth every 6 (six) hours as needed. 04/22/20   Enid Derry, PA-C  ondansetron (ZOFRAN) 8 MG tablet Take 1 tablet (8 mg total) by mouth every 8 (eight) hours as needed for nausea or vomiting. 10/05/20   Joni Reining, PA-C  promethazine (PHENERGAN) 12.5 MG tablet Take 1 tablet (12.5 mg total) by mouth every  6 (six) hours as needed for nausea or vomiting. 05/22/18   Willy Eddy, MD    Allergies Patient has no known allergies.  No family history on file.  Social History Social History   Tobacco Use   Smoking status: Every Day    Pack years: 0.00   Smokeless tobacco: Never  Vaping Use   Vaping Use: Never used  Substance Use Topics   Alcohol use: Yes   Drug use: Yes    Types: Marijuana    Comment: "everyday"    Review of Systems  Constitutional: No fever/chills Eyes: No visual changes. ENT: No sore throat. Cardiovascular: Denies chest pain. Respiratory: Denies shortness of breath. Gastrointestinal: abdominal pain.  No nausea, no vomiting.  No diarrhea.  No constipation. Genitourinary: Negative for dysuria.  No discharge Musculoskeletal: Negative for back pain. Skin: Negative for rash. Neurological: Negative for headaches, focal weakness   ____________________________________________   PHYSICAL EXAM:  VITAL SIGNS: ED Triage Vitals [12/27/20 0621]  Enc Vitals Group     BP (!) 156/105     Pulse Rate 78     Resp 16     Temp 98.2 F (36.8 C)     Temp src      SpO2 97 %     Weight 176 lb (79.8 kg)     Height 5\' 9"  (1.753 m)     Head Circumference  Peak Flow      Pain Score 8     Pain Loc      Pain Edu?      Excl. in GC?    Constitutional: Alert and oriented. Well appearing and in no acute distress. Eyes: Conjunctivae are normal.  Head: Atraumatic. Nose: No congestion/rhinnorhea. Mouth/Throat: Mucous membranes are moist.  Oropharynx non-erythematous. Neck: No stridor.  Cardiovascular: Normal rate, regular rhythm. Grossly normal heart sounds.  Good peripheral circulation. Respiratory: Normal respiratory effort.  No retractions. Lungs CTAB. Gastrointestinal: Soft palpation of the mid upper abdomen.  Reproduces his pain exactly no distention. No abdominal bruits. No CVA tenderness. Musculoskeletal: No lower extremity tenderness nor edema.   Neurologic:   Normal speech and language. No gross focal neurologic deficits are appreciated. No gait instability. Skin:  Skin is warm, dry and intact. No rash noted.  ____________________________________________   LABS (all labs ordered are listed, but only abnormal results are displayed)  Labs Reviewed  CBC WITH DIFFERENTIAL/PLATELET - Abnormal; Notable for the following components:      Result Value   RBC 4.17 (*)    All other components within normal limits  COMPREHENSIVE METABOLIC PANEL - Abnormal; Notable for the following components:   Glucose, Bld 104 (*)    Calcium 8.8 (*)    All other components within normal limits  URINALYSIS, COMPLETE (UACMP) WITH MICROSCOPIC - Abnormal; Notable for the following components:   Color, Urine YELLOW (*)    APPearance CLEAR (*)    Leukocytes,Ua TRACE (*)    All other components within normal limits  LIPASE, BLOOD   ____________________________________________  EKG   ____________________________________________  RADIOLOGY Jill Poling, personally viewed and evaluated these images (plain radiographs) as part of my medical decision making, as well as reviewing the written report by the radiologist.  ED MD interpretation:   Official radiology report(s): No results found.  ____________________________________________   PROCEDURES  Procedure(s) performed (including Critical Care):  Procedures   ____________________________________________   INITIAL IMPRESSION / ASSESSMENT AND PLAN / ED COURSE  As part of my medical decision making, I reviewed the following data within the electronic MEDICAL RECORD NUMBERLab work and old visits to the hospital  Patient asked 3 different times and HR he says this is exactly the same pain as he has had in the past that went away with his H. 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  He has not had any fever nausea vomiting or diarrhea or any other symptoms but the pain.  I will try to  treat this with PPI tetracycline Pepto and Flagyl for 14 days as recommended by up-to-date and refer him to GI.  He will return if his symptoms worsen or change at all.  Discussed at length with pharmacist.  Will use over-the-counter omeprazole Pepto over-the-counter Flagyl 503 times a day for 10 days and Doxy 100 twice a day for 10 days that should result in a significant cost reduction.       ____________________________________________   FINAL CLINICAL IMPRESSION(S) / ED DIAGNOSES  Final diagnoses:  Pain of upper abdomen     ED Discharge Orders          Ordered    bismuth-metronidazole-tetracycline (PYLERA) [phone] MG capsule  3 times daily before meals & bedtime        12/27/20 0747    pantoprazole (PROTONIX) 40 MG tablet  Daily        12/27/20 0747  Note:  This document was prepared using Dragon voice recognition software and may include unintentional dictation errors.    Arnaldo Natal, MD 12/27/20 0748 ----------------------------------------- 9:40 AM on 12/27/2020 ----------------------------------------- Patient calls back.  He says the Protonix is $80 and the Pylera is thousand $500.  This is obviously more than he can afford.  We will try to talk to the pharmacist and get the medicines broken down into their individual components Pepto-Bismol which is part of pylori should be very cheap.   Arnaldo Natal, MD 12/27/20 [phone]

## 2020-12-27 NOTE — ED Notes (Signed)
This RN agrees with triage note  Pt denies burning on urination or increased frequency. Pt denies fevers or chills. NAD noted.

## 2020-12-27 NOTE — ED Notes (Signed)
D/C and new RX discussed with pt, pt verbalized understanding. Pt ambulatory with steady gait on D/C. Pt given work note per request.

## 2020-12-27 NOTE — ED Triage Notes (Signed)
Patient ambulatory to triage with steady gait, without difficulty or distress noted; pt reports mid abd pain accomp by decreased appetite and nausea since childhood; st was dx with H pylori in past and rx zantac; recently has been taking gaviscon chewables without relief

## 2020-12-27 NOTE — Discharge Instructions (Addendum)
It sounds like you are having a recurrence of your H. pylori.  I will give you some Protonix to take 1 a day and some Pylera which is to treat the H. pylori.  This medicine combines Pepto-Bismol Flagyl and tetracycline and is supposed to be taking his 3 pills 4 times a day.  Please return if you are worse or develop a fever vomiting or new symptoms.  I would like you to follow-up with gastroenterology.  Dr. Mia Creek is on-call and provided their phone number and address.  Please call them and get a follow-up appointment.

## 2021-01-31 ENCOUNTER — Other Ambulatory Visit: Payer: Self-pay

## 2021-01-31 ENCOUNTER — Encounter: Payer: Self-pay | Admitting: Emergency Medicine

## 2021-01-31 ENCOUNTER — Emergency Department
Admission: EM | Admit: 2021-01-31 | Discharge: 2021-01-31 | Disposition: A | Discharge status: Home / Self Care | Payer: Self-pay | Attending: Emergency Medicine | Admitting: Emergency Medicine

## 2021-01-31 DIAGNOSIS — K2970 Gastritis, unspecified, without bleeding: Secondary | ICD-10-CM | POA: Insufficient documentation

## 2021-01-31 DIAGNOSIS — F17200 Nicotine dependence, unspecified, uncomplicated: Secondary | ICD-10-CM | POA: Insufficient documentation

## 2021-01-31 HISTORY — DX: Other specified bacterial intestinal infections: A04.8

## 2021-01-31 LAB — URINALYSIS, COMPLETE (UACMP) WITH MICROSCOPIC
Bilirubin Urine: NEGATIVE
Glucose, UA: NEGATIVE mg/dL
Hgb urine dipstick: NEGATIVE
Ketones, ur: NEGATIVE mg/dL
Leukocytes,Ua: NEGATIVE
Nitrite: NEGATIVE
Protein, ur: NEGATIVE mg/dL
Specific Gravity, Urine: 1.027 (ref 1.005–1.030)
pH: 5 (ref 5.0–8.0)

## 2021-01-31 LAB — COMPREHENSIVE METABOLIC PANEL
ALT: 28 U/L (ref 0–44)
AST: 33 U/L (ref 15–41)
Albumin: 4.5 g/dL (ref 3.5–5.0)
Alkaline Phosphatase: 84 U/L (ref 38–126)
Anion gap: 6 (ref 5–15)
BUN: 15 mg/dL (ref 6–20)
CO2: 25 mmol/L (ref 22–32)
Calcium: 9.5 mg/dL (ref 8.9–10.3)
Chloride: 108 mmol/L (ref 98–111)
Creatinine, Ser: 0.84 mg/dL (ref 0.61–1.24)
GFR, Estimated: >60 mL/min (ref >60)
Glucose, Bld: 104 mg/dL — ABNORMAL HIGH (ref 70–99)
Potassium: 4.1 mmol/L (ref 3.5–5.1)
Sodium: 139 mmol/L (ref 135–145)
Total Bilirubin: 1 mg/dL (ref 0.3–1.2)
Total Protein: 7.6 g/dL (ref 6.5–8.1)

## 2021-01-31 LAB — CBC
HCT: 42.1 % (ref 39.0–52.0)
Hemoglobin: 14.3 g/dL (ref 13.0–17.0)
MCH: 33.1 pg (ref 26.0–34.0)
MCHC: 34 g/dL (ref 30.0–36.0)
MCV: 97.5 fL (ref 80.0–100.0)
Platelets: 176 10*3/uL (ref 150–400)
RBC: 4.32 MIL/uL (ref 4.22–5.81)
RDW: 11.9 % (ref 11.5–15.5)
WBC: 6.4 10*3/uL (ref 4.0–10.5)
nRBC: 0 % (ref 0.0–0.2)

## 2021-01-31 LAB — LIPASE, BLOOD: Lipase: 26 U/L (ref 11–51)

## 2021-01-31 MED ORDER — OMEPRAZOLE MAGNESIUM 20 MG PO TBEC: 20.0000 mg | DELAYED_RELEASE_TABLET | Freq: Every day | ORAL | 0 refills | Status: AC

## 2021-01-31 MED ORDER — FAMOTIDINE 20 MG PO TABS
20.0000 mg | ORAL_TABLET | Freq: Once | ORAL | Status: AC
  Administered 2021-01-31: 20 mg via ORAL
  Filled 2021-01-31: qty 1

## 2021-01-31 MED ORDER — LIDOCAINE VISCOUS HCL 2 % MT SOLN
15.0000 mL | Freq: Once | OROMUCOSAL | Status: AC
  Administered 2021-01-31: 15 mL via ORAL
  Filled 2021-01-31: qty 15

## 2021-01-31 MED ORDER — ALUM & MAG HYDROXIDE-SIMETH 200-200-20 MG/5ML PO SUSP
30.0000 mL | Freq: Once | ORAL | Status: AC
  Administered 2021-01-31: 30 mL via ORAL
  Filled 2021-01-31: qty 30

## 2021-01-31 NOTE — ED Provider Notes (Signed)
[facility]  ____________________________________________   Event Date/Time   First MD Initiated Contact with Patient 01/31/21 1436     (approximate)  I have reviewed the triage vital signs and the nursing notes.   HISTORY  Chief Complaint Abdominal Pain    HPI Richard Parks is a 45 y.o. male past medical history of H. pylori infection who presents with epigastric pain.  Symptoms started last night.  The patient has had similar pain in the past.  Many years ago he had the symptoms and was treated for H. pylori and symptoms seem to resolve.  Then last month his symptoms recurred and he was seen here in the ED and prescribed treatment for H. pylori as well as over-the-counter PPI and symptoms briefly resolved but now returned.  He has had nausea but no emesis.  He denies diarrhea.  Has not had fevers or chills.  The pain is described as a burning sensation in the epigastric region that does not radiate.  It has been intermittent.  He is not currently taking anything over-the-counter for acid production.  He did complete the prescribed course of antibiotics for H. pylori.         Past Medical History:  Diagnosis Date   H. pylori infection     There are no problems to display for this patient.   History reviewed. No pertinent surgical history.  Prior to Admission medications   Medication Sig Start Date End Date Taking? Authorizing Provider  omeprazole (PRILOSEC OTC) 20 MG tablet Take 1 tablet (20 mg total) by mouth daily. 01/31/21 04/01/21 Yes Georga Hacking, MD  bismuth-metronidazole-tetracycline [facility]) [phone] MG capsule Take 3 capsules by mouth 4 (four) times daily -  before meals and at bedtime. 12/27/20   Arnaldo Natal, MD  ibuprofen (ADVIL) 600 MG tablet Take 1 tablet (600 mg total) by mouth every 6 (six) hours as needed. 04/22/20   Enid Derry, PA-C  ondansetron (ZOFRAN) 8 MG tablet Take 1 tablet (8 mg total) by mouth every 8 (eight)  hours as needed for nausea or vomiting. 10/05/20   Joni Reining, PA-C  pantoprazole (PROTONIX) 40 MG tablet Take 1 tablet (40 mg total) by mouth daily. 12/27/20 12/27/21  Arnaldo Natal, MD  promethazine (PHENERGAN) 12.5 MG tablet Take 1 tablet (12.5 mg total) by mouth every 6 (six) hours as needed for nausea or vomiting. 05/22/18   Willy Eddy, MD    Allergies Patient has no known allergies.  History reviewed. No pertinent family history.  Social History Social History   Tobacco Use   Smoking status: Every Day   Smokeless tobacco: Never  Vaping Use   Vaping Use: Never used  Substance Use Topics   Alcohol use: Yes   Drug use: Yes    Types: Marijuana    Comment: "everyday"    Review of Systems   Review of Systems  Constitutional:  Negative for appetite change, chills, fever and unexpected weight change.  Respiratory:  Negative for shortness of breath.   Cardiovascular:  Negative for chest pain.  Gastrointestinal:  Positive for abdominal pain and nausea. Negative for blood in stool and vomiting.  Genitourinary:  Negative for dysuria.  All other systems reviewed and are negative.  Physical Exam Updated Vital Signs BP (!) 143/103   Pulse 63   Temp 98.4 F (36.9 C) (Oral)   Resp 18   Ht 5\' 9"  (1.753 m)   Wt 79.8 kg   SpO2 97%  BMI 25.98 kg/m   Physical Exam Vitals and nursing note reviewed.  Constitutional:      General: He is not in acute distress.    Appearance: Normal appearance.  HENT:     Head: Normocephalic and atraumatic.  Eyes:     General: No scleral icterus.    Conjunctiva/sclera: Conjunctivae normal.  Pulmonary:     Effort: Pulmonary effort is normal. No respiratory distress.     Breath sounds: Normal breath sounds. No wheezing.  Abdominal:     General: Abdomen is flat. There is no distension.     Palpations: Abdomen is soft.     Tenderness: There is abdominal tenderness in the epigastric area.     Comments: Tenderness to deep palpation in  the epigastric region  Musculoskeletal:        General: No deformity or signs of injury.     Cervical back: Normal range of motion.  Skin:    Coloration: Skin is not jaundiced or pale.  Neurological:     General: No focal deficit present.     Mental Status: He is alert and oriented to person, place, and time. Mental status is at baseline.  Psychiatric:        Mood and Affect: Mood normal.        Behavior: Behavior normal.     LABS (all labs ordered are listed, but only abnormal results are displayed)  Labs Reviewed  COMPREHENSIVE METABOLIC PANEL - Abnormal; Notable for the following components:      Result Value   Glucose, Bld 104 (*)    All other components within normal limits  URINALYSIS, COMPLETE (UACMP) WITH MICROSCOPIC - Abnormal; Notable for the following components:   Color, Urine YELLOW (*)    APPearance CLEAR (*)    Bacteria, UA RARE (*)    All other components within normal limits  LIPASE, BLOOD  CBC   ____________________________________________  EKG  N/a ____________________________________________  RADIOLOGY Ky Barban, personally viewed and evaluated these images (plain radiographs) as part of my medical decision making, as well as reviewing the written report by the radiologist.  ED MD interpretation:  n/a    ____________________________________________   PROCEDURES  Procedure(s) performed (including Critical Care):  Procedures   ____________________________________________   INITIAL IMPRESSION / ASSESSMENT AND PLAN / ED COURSE     The patient is a 45 year old male who presents with recurrent epigastric burning sensation.  Has been treated twice now for H. pylori, most recently completed a course of antibiotics just 1 month ago prescribed from the ED.  He has not seen a primary or GI recently nor do I believe he has ever had endoscopy.  Vital signs are within normal limits.  His labs are also normal.  He has no other red flags  including weight loss or melena.  I looked at his gallbladder on the ultrasound to rule out biliary colic/cholecystitis as etiology of his pain, and his gallbladder was normal with negative sonographic Murphy sign, no stones and normal gallbladder wall.  I suspect that this is gastritis versus peptic ulcer disease.  Will treat with GI cocktail and Pepcid here.  Will discharge with PPI.  Advised the patient that he needs to follow-up with both GI and a primary doctor who can follow him for this.  Unfortunately he does not have medical insurance.      ____________________________________________   FINAL CLINICAL IMPRESSION(S) / ED DIAGNOSES  Final diagnoses:  Gastritis without bleeding, unspecified chronicity, unspecified gastritis type  ED Discharge Orders          Ordered    omeprazole (PRILOSEC OTC) 20 MG tablet  Daily        01/31/21 1531             Note:  This document was prepared using Dragon voice recognition software and may include unintentional dictation errors.    Georga Hacking, MD 01/31/21 [phone]

## 2021-01-31 NOTE — ED Triage Notes (Signed)
Pt comes into the ED via POV c/o stomach pain that has been ongoing x 1 month intermittent.  Pt states that he has had similar pain before in the past of 2009 which was diagnosed as H. Pylori.  Pt states he has a "burning" sensation in his stomach as well as nausea and vomiting. Pt in NAD at this time.

## 2021-01-31 NOTE — Discharge Instructions (Addendum)
I have written you prescription for omeprazole.  You can also purchase this over-the-counter if you cannot afford the prescription.  Please follow-up at the clinic above as you should have a physician following you for this pain.  Ideally you need to see a gastroenterologist as you may need an endoscopy to further evaluate why you are having this pain.

## 2021-04-18 ENCOUNTER — Other Ambulatory Visit: Payer: Self-pay

## 2021-04-18 ENCOUNTER — Emergency Department: Payer: Self-pay

## 2021-04-18 ENCOUNTER — Emergency Department
Admission: EM | Admit: 2021-04-18 | Discharge: 2021-04-18 | Disposition: A | Discharge status: Home / Self Care | Payer: Self-pay | Attending: Emergency Medicine | Admitting: Emergency Medicine

## 2021-04-18 DIAGNOSIS — F17210 Nicotine dependence, cigarettes, uncomplicated: Secondary | ICD-10-CM | POA: Insufficient documentation

## 2021-04-18 DIAGNOSIS — X58XXXA Exposure to other specified factors, initial encounter: Secondary | ICD-10-CM | POA: Insufficient documentation

## 2021-04-18 DIAGNOSIS — M7702 Medial epicondylitis, left elbow: Secondary | ICD-10-CM | POA: Insufficient documentation

## 2021-04-18 DIAGNOSIS — S5002XA Contusion of left elbow, initial encounter: Secondary | ICD-10-CM | POA: Insufficient documentation

## 2021-04-18 DIAGNOSIS — Y990 Civilian activity done for income or pay: Secondary | ICD-10-CM | POA: Insufficient documentation

## 2021-04-18 NOTE — ED Notes (Signed)
Rest , ice , elevate and compress , all dc instructions given to patient , follow up pcp

## 2021-04-18 NOTE — ED Triage Notes (Signed)
Pt reports hitting left elbow on Monday and elbow started hurting with certain movements. NAD note, no obvious injuries.

## 2021-04-18 NOTE — ED Provider Notes (Signed)
[facility] Emergency Department Provider Note ____________________________________________  Time seen: 61  I have reviewed the triage vital signs and the nursing notes.  HISTORY  Chief Complaint  Elbow Pain   HPI Richard Parks is a 45 y.o. right-handed male presents to the ED for evaluation of left elbow pain.  He reports elbow contusion at work few days prior.  Since that time has had pain to the medial epicondyle.  He reports some distal paresthesias but denies any grip changes.  He also denies any swelling or deformity.  Past Medical History:  Diagnosis Date   H. pylori infection     There are no problems to display for this patient.   History reviewed. No pertinent surgical history.  Prior to Admission medications   Medication Sig Start Date End Date Taking? Authorizing Provider  bismuth-metronidazole-tetracycline [facility]) [id number] MG capsule Take 3 capsules by mouth 4 (four) times daily -  before meals and at bedtime. 12/27/20   Arnaldo Natal, MD  omeprazole (PRILOSEC OTC) 20 MG tablet Take 1 tablet (20 mg total) by mouth daily. 01/31/21 04/01/21  Georga Hacking, MD  pantoprazole (PROTONIX) 40 MG tablet Take 1 tablet (40 mg total) by mouth daily. 12/27/20 12/27/21  Arnaldo Natal, MD  promethazine (PHENERGAN) 12.5 MG tablet Take 1 tablet (12.5 mg total) by mouth every 6 (six) hours as needed for nausea or vomiting. 05/22/18   Willy Eddy, MD    Allergies Patient has no known allergies.  History reviewed. No pertinent family history.  Social History Social History   Tobacco Use   Smoking status: Every Day   Smokeless tobacco: Never  Vaping Use   Vaping Use: Never used  Substance Use Topics   Alcohol use: Yes   Drug use: Yes    Types: Marijuana    Comment: "everyday"    Review of Systems  Constitutional: Negative for fever. Eyes: Negative for visual changes. ENT: Negative for sore throat. Cardiovascular: Negative for  chest pain. Respiratory: Negative for shortness of breath. Gastrointestinal: Negative for abdominal pain, vomiting and diarrhea. Genitourinary: Negative for dysuria. Musculoskeletal: Negative for back pain.  Left elbow pain as above. Skin: Negative for rash. Neurological: Negative for headaches, focal weakness or numbness. ____________________________________________  PHYSICAL EXAM:  VITAL SIGNS: ED Triage Vitals  Enc Vitals Group     BP 04/18/21 0916 (!) 139/93     Pulse Rate 04/18/21 0916 69     Resp 04/18/21 0916 16     Temp 04/18/21 0916 98 F (36.7 C)     Temp Source 04/18/21 0916 Oral     SpO2 04/18/21 0916 95 %     Weight 04/18/21 0923 175 lb (79.4 kg)     Height 04/18/21 0923 5\' 9"  (1.753 m)     Head Circumference --      Peak Flow --      Pain Score 04/18/21 0924 8     Pain Loc --      Pain Edu? --      Excl. in GC? --     Constitutional: Alert and oriented. Well appearing and in no distress. Head: Normocephalic and atraumatic. Eyes: Conjunctivae are normal. Normal extraocular movements Cardiovascular: Normal rate, regular rhythm. Normal distal pulses. Respiratory: Normal respiratory effort. No wheezes/rales/rhonchi. Gastrointestinal: Soft and nontender. No distention. Musculoskeletal: Left elbow without obvious deformity, dislocation, joint effusion.  Patient did not palpation over the medial epicondyle.  Normal composite fist distally.  Normal flexion extension range of the  wrist noted.  Nontender with normal range of motion in all extremities.  Neurologic: Cranial nerves II to XII grossly intact.  Normal intrinsic opposition testing noted.  Negative cubital or carpal Tinel.  Normal gait without ataxia. Normal speech and language. No gross focal neurologic deficits are appreciated. Skin:  Skin is warm, dry and intact. No rash noted. Psychiatric: Mood and affect are normal. Patient exhibits appropriate insight and  judgment. ____________________________________________    {LABS (pertinent positives/negatives)  ____________________________________________  {EKG  ____________________________________________   RADIOLOGY Official radiology report(s): DG Elbow Complete Left  Result Date: 04/18/2021 CLINICAL DATA:  Hit elbow EXAM: LEFT ELBOW - COMPLETE 3+ VIEW COMPARISON:  None. FINDINGS: No evidence of fracture of the ulna or humerus. The radial head is normal. No joint effusion. IMPRESSION: No fracture or dislocation. Electronically Signed   By: Genevive Bi M.D.   On: 04/18/2021 10:26   ____________________________________________  PROCEDURES  Ace wrap Procedures ____________________________________________   INITIAL IMPRESSION / ASSESSMENT AND PLAN / ED COURSE  As part of my medical decision making, I reviewed the following data within the electronic MEDICAL RECORD NUMBER Radiograph reviewed WNL and Notes from prior ED visits  DDX: elbow contusion, elbow fracture, epicondylitis, bursitis    Patient ED evaluation of left elbow pain to the medial epicondyle.  Patient reports onset after contusion to the same region.  He presents with no radiologic evidence of any acute fracture or dislocation.  Clinically the patient stable without any neuromuscular deficit.  Normal composite fist and pronation supination is noted.  Patient will be placed in Ace bandage for support, and discharged with instructions to take over-the-counter Tylenol as needed.  Follow-up with primary provider return to the ED if needed.  Work note is provided for today as requested.     Richard Parks was evaluated in Emergency Department on 04/18/2021 for the symptoms described in the history of present illness. He was evaluated in the context of the global COVID-19 pandemic, which necessitated consideration that the patient might be at risk for infection with the SARS-CoV-2 virus that causes COVID-19. Institutional protocols and  algorithms that pertain to the evaluation of patients at risk for COVID-19 are in a state of rapid change based on information released by regulatory bodies including the CDC and federal and state organizations. These policies and algorithms were followed during the patient's care in the ED. ____________________________________________  FINAL CLINICAL IMPRESSION(S) / ED DIAGNOSES  Final diagnoses:  Contusion of left elbow, initial encounter  Medial epicondylitis of elbow, left      Jaretzy Lhommedieu, Charlesetta Ivory, PA-C 04/18/21 1051    Shaune Pollack, MD 04/18/21 1858

## 2021-04-27 ENCOUNTER — Emergency Department: Payer: Self-pay

## 2021-04-27 ENCOUNTER — Encounter: Payer: Self-pay | Admitting: Emergency Medicine

## 2021-04-27 ENCOUNTER — Emergency Department
Admission: EM | Admit: 2021-04-27 | Discharge: 2021-04-27 | Disposition: A | Discharge status: Home / Self Care | Payer: Self-pay | Attending: Student in an Organized Health Care Education/Training Program | Admitting: Student in an Organized Health Care Education/Training Program

## 2021-04-27 ENCOUNTER — Other Ambulatory Visit: Payer: Self-pay

## 2021-04-27 DIAGNOSIS — M79671 Pain in right foot: Secondary | ICD-10-CM

## 2021-04-27 DIAGNOSIS — F17200 Nicotine dependence, unspecified, uncomplicated: Secondary | ICD-10-CM | POA: Insufficient documentation

## 2021-04-27 DIAGNOSIS — S9031XA Contusion of right foot, initial encounter: Secondary | ICD-10-CM | POA: Insufficient documentation

## 2021-04-27 DIAGNOSIS — W228XXA Striking against or struck by other objects, initial encounter: Secondary | ICD-10-CM | POA: Insufficient documentation

## 2021-04-27 DIAGNOSIS — S90121A Contusion of right lesser toe(s) without damage to nail, initial encounter: Secondary | ICD-10-CM

## 2021-04-27 NOTE — ED Triage Notes (Signed)
Pt comes into the ED via POV c/o right 4th digit toe pain after jamming it on the toilet while changing clothes.  Pt has redness to the toe.  Pt in NAD at this time and is ambulatory to triage.

## 2021-04-27 NOTE — ED Provider Notes (Signed)
[facility] Emergency Department Provider Note  ____________________________________________   Event Date/Time   First MD Initiated Contact with Patient 04/27/21 1217     (approximate)  I have reviewed the triage vital signs and the nursing notes.   HISTORY  Chief Complaint Toe Pain   HPI Richard Parks is a 45 y.o. male presents to the ED with complaint of right fourth toe pain after injury when he bumped it against the toilet.  This injury occurred yesterday and he tried wearing his steel toed shoes while at work but was having a lot of pain and was told to have this evaluated.  He denies any previous injury to his foot.  He rates pain as 6 out of 10.       Past Medical History:  Diagnosis Date   H. pylori infection     There are no problems to display for this patient.   History reviewed. No pertinent surgical history.  Prior to Admission medications   Medication Sig Start Date End Date Taking? Authorizing Provider  bismuth-metronidazole-tetracycline [facility]) [id number] MG capsule Take 3 capsules by mouth 4 (four) times daily -  before meals and at bedtime. 12/27/20   Arnaldo Natal, MD  omeprazole (PRILOSEC OTC) 20 MG tablet Take 1 tablet (20 mg total) by mouth daily. 01/31/21 04/01/21  Georga Hacking, MD  pantoprazole (PROTONIX) 40 MG tablet Take 1 tablet (40 mg total) by mouth daily. 12/27/20 12/27/21  Arnaldo Natal, MD  promethazine (PHENERGAN) 12.5 MG tablet Take 1 tablet (12.5 mg total) by mouth every 6 (six) hours as needed for nausea or vomiting. 05/22/18   Willy Eddy, MD    Allergies Patient has no known allergies.  History reviewed. No pertinent family history.  Social History Social History   Tobacco Use   Smoking status: Every Day   Smokeless tobacco: Never  Vaping Use   Vaping Use: Never used  Substance Use Topics   Alcohol use: Yes   Drug use: Yes    Types: Marijuana    Comment: "everyday"    Review of  Systems Constitutional: No fever/chills Eyes: No visual changes. ENT: No sore throat. Cardiovascular: Denies chest pain. Respiratory: Denies shortness of breath. Gastrointestinal: No abdominal pain.  No nausea, no vomiting.  No diarrhea.  No constipation. Genitourinary: Negative for dysuria. Musculoskeletal: Positive for right fourth toe pain. Skin: Positive for bruising right fourth toe. Neurological: Negative for headaches, focal weakness or numbness. ____________________________________________   PHYSICAL EXAM:  VITAL SIGNS: ED Triage Vitals  Enc Vitals Group     BP 04/27/21 1001 (!) 142/96     Pulse Rate 04/27/21 1001 76     Resp 04/27/21 1001 16     Temp 04/27/21 1001 98.5 F (36.9 C)     Temp Source 04/27/21 1001 Oral     SpO2 04/27/21 1001 96 %     Weight 04/27/21 1002 175 lb 0.7 oz (79.4 kg)     Height 04/27/21 1002 5\' 9"  (1.753 m)     Head Circumference --      Peak Flow --      Pain Score 04/27/21 1002 6     Pain Loc --      Pain Edu? --      Excl. in GC? --     Constitutional: Alert and oriented. Well appearing and in no acute distress. Eyes: Conjunctivae are normal.  Head: Atraumatic. Neck: No stridor.   Cardiovascular: Normal rate, regular rhythm. Grossly  normal heart sounds.  Good peripheral circulation. Respiratory: Normal respiratory effort.  No retractions. Lungs CTAB. Musculoskeletal: Right foot fourth digit is edematous with ecchymosis noted to the distal portion.  Nail is intact and without injury.  Moderate tenderness on gentle palpation.  Motor sensory function intact.  Capillary refill is less than 3 seconds.  Range of motion is slow and guarded secondary to increased pain. Neurologic:  Normal speech and language. No gross focal neurologic deficits are appreciated. No gait instability. Skin:  Skin is warm, dry and intact.  Psychiatric: Mood and affect are normal. Speech and behavior are normal.  ____________________________________________    LABS (all labs ordered are listed, but only abnormal results are displayed)  Labs Reviewed - No data to display ____________________________________________ ____________________________________________  RADIOLOGY Beaulah Corin, personally viewed and evaluated these images (plain radiographs) as part of my medical decision making, as well as reviewing the written report by the radiologist.    Official radiology report(s): DG Foot Complete Right  Result Date: 04/27/2021 CLINICAL DATA:  Injury to fourth toe, pain EXAM: RIGHT FOOT COMPLETE - 3+ VIEW COMPARISON:  None. FINDINGS: There is no evidence of fracture or dislocation. There is no evidence of arthropathy or other focal bone abnormality. Soft tissues are unremarkable. IMPRESSION: No fracture or dislocation right foot with specific attention to the right fourth digit. Electronically Signed   By: Jearld Lesch M.D.   On: 04/27/2021 10:44    ____________________________________________   PROCEDURES  Procedure(s) performed (including Critical Care):  Procedures   ____________________________________________   INITIAL IMPRESSION / ASSESSMENT AND PLAN / ED COURSE  As part of my medical decision making, I reviewed the following data within the electronic MEDICAL RECORD NUMBER Notes from prior ED visits and [place] Controlled Substance Database  45 year old male presents to the ED with complaint of right foot injury that occurred yesterday.  Patient has continued to have swelling and pain to his right fourth toe.  X-rays were obtained and no fracture was noted although there is soft tissue injury with discoloration.  Patient was given a note for work.  He is encouraged to ice and elevate and take over-the-counter medication as needed for pain and inflammation.  He is to follow-up with his PCP or Dr. Excell Seltzer who is on-call for podiatry if any continued problems. ____________________________________________   FINAL CLINICAL IMPRESSION(S) /  ED DIAGNOSES  Final diagnoses:  Contusion of fourth toe of right foot, initial encounter     ED Discharge Orders     None        Note:  This document was prepared using Dragon voice recognition software and may include unintentional dictation errors.    Tommi Rumps, PA-C 04/27/21 1446    Willy Eddy, MD 04/27/21 1536

## 2021-04-27 NOTE — Discharge Instructions (Signed)
Follow-up with your primary care provider or Dr. Excell Seltzer who is the podiatrist on-call if any worsening of your symptoms or not improving.  Even with a bruise it generally takes 3 to 4 weeks before your toe is feeling back to normal.  Ice and elevation as needed for swelling.  Take Tylenol every 6 hours as needed for pain.  Wear good supportive shoes when walking.

## 2021-05-22 ENCOUNTER — Other Ambulatory Visit: Payer: Self-pay

## 2021-05-22 ENCOUNTER — Emergency Department
Admission: EM | Admit: 2021-05-22 | Discharge: 2021-05-22 | Disposition: A | Discharge status: Home / Self Care | Payer: Self-pay | Attending: Emergency Medicine | Admitting: Emergency Medicine

## 2021-05-22 DIAGNOSIS — J069 Acute upper respiratory infection, unspecified: Secondary | ICD-10-CM | POA: Insufficient documentation

## 2021-05-22 DIAGNOSIS — Z20822 Contact with and (suspected) exposure to covid-19: Secondary | ICD-10-CM | POA: Insufficient documentation

## 2021-05-22 DIAGNOSIS — R0981 Nasal congestion: Secondary | ICD-10-CM

## 2021-05-22 DIAGNOSIS — F17200 Nicotine dependence, unspecified, uncomplicated: Secondary | ICD-10-CM | POA: Insufficient documentation

## 2021-05-22 LAB — RESP PANEL BY RT-PCR (FLU A&B, COVID) ARPGX2
Influenza A by PCR: NEGATIVE
Influenza B by PCR: NEGATIVE
SARS Coronavirus 2 by RT PCR: NEGATIVE

## 2021-05-22 MED ORDER — ONDANSETRON 4 MG PO TBDP: 4.0000 mg | ORAL_TABLET | Freq: Three times a day (TID) | ORAL | 0 refills | Status: AC | PRN

## 2021-05-22 NOTE — ED Triage Notes (Signed)
Pt reports he started getting nauseous today at work and has nasal congestion. Work wanted him to get checked out. Pt denies complaints at this time other than nausea. NAD noted.

## 2021-05-22 NOTE — Discharge Instructions (Signed)
Please take Tylenol and ibuprofen/Advil for your pain.  It is safe to take them together, or to alternate them every few hours.  Take up to 1000mg  of Tylenol at a time, up to 4 times per day.  Do not take more than 4000 mg of Tylenol in 24 hours.  For ibuprofen, take 400-600 mg, 4-5 times per day.  Use Zofran as needed for nausea/vomiting

## 2021-05-22 NOTE — ED Provider Notes (Signed)
[facility] Emergency Department Provider Note ____________________________________________   Event Date/Time   First MD Initiated Contact with Patient 05/22/21 1140     (approximate)  I have reviewed the triage vital signs and the nursing notes.  HISTORY  Chief Complaint Nausea and Nasal Congestion   HPI Richard Parks is a 45 y.o. malewho presents to the ED for evaluation of congestion and nausea.  Chart review indicates no relevant history.  Patient presents to the ED for evaluation of new onset nasal congestion, and a transient episode of nausea while at work this morning.  He was urged to come to the ED by his boss get checked out and states check for flu and COVID.  He reports feeling fine yesterday and this morning when he got to work.  He developed a "tickle in his throat" and had a single nonproductive coughing fit.  After this he has felt congested in his nose, has transient nausea without emesis.  Denies abdominal pain, productive cough, chest pain, shortness of breath, syncope or dizziness.  Past Medical History:  Diagnosis Date   H. pylori infection     There are no problems to display for this patient.   History reviewed. No pertinent surgical history.  Prior to Admission medications   Medication Sig Start Date End Date Taking? Authorizing Provider  ondansetron (ZOFRAN-ODT) 4 MG disintegrating tablet Take 1 tablet (4 mg total) by mouth every 8 (eight) hours as needed for nausea or vomiting. 05/22/21  Yes Delton Prairie, MD  bismuth-metronidazole-tetracycline [facility]) [id number] MG capsule Take 3 capsules by mouth 4 (four) times daily -  before meals and at bedtime. 12/27/20   Arnaldo Natal, MD  omeprazole (PRILOSEC OTC) 20 MG tablet Take 1 tablet (20 mg total) by mouth daily. 01/31/21 04/01/21  Georga Hacking, MD  pantoprazole (PROTONIX) 40 MG tablet Take 1 tablet (40 mg total) by mouth daily. 12/27/20 12/27/21  Arnaldo Natal, MD   promethazine (PHENERGAN) 12.5 MG tablet Take 1 tablet (12.5 mg total) by mouth every 6 (six) hours as needed for nausea or vomiting. 05/22/18   Willy Eddy, MD    Allergies Patient has no known allergies.  No family history on file.  Social History Social History   Tobacco Use   Smoking status: Every Day   Smokeless tobacco: Never  Vaping Use   Vaping Use: Never used  Substance Use Topics   Alcohol use: Yes   Drug use: Yes    Types: Marijuana    Comment: "everyday"    Review of Systems  Constitutional: No fever/chills Eyes: No visual changes. ENT: No sore throat.  Positive for upper respiratory congestion Cardiovascular: Denies chest pain. Respiratory: Denies shortness of breath. Gastrointestinal: No abdominal pain. no vomiting.  No diarrhea.  No constipation. Positive for nausea Genitourinary: Negative for dysuria. Musculoskeletal: Negative for back pain. Skin: Negative for rash. Neurological: Negative for headaches, focal weakness or numbness.   ____________________________________________   PHYSICAL EXAM:  VITAL SIGNS: Vitals:   05/22/21 1031  BP: 122/90  Pulse: 74  Resp: 20  Temp: 98.3 F (36.8 C)  SpO2: 97%     Constitutional: Alert and oriented. Well appearing and in no acute distress. Eyes: Conjunctivae are normal. PERRL. EOMI. Head: Atraumatic. Nose: Mild clear congestion/rhinnorhea. Mouth/Throat: Mucous membranes are moist.  Oropharynx non-erythematous. Uvula midline and tonsils 1+ bilaterally. Neck: No stridor. No cervical spine tenderness to palpation. Cardiovascular: Normal rate, regular rhythm. Grossly normal heart sounds.  Good peripheral circulation.  Respiratory: Normal respiratory effort.  No retractions. Lungs CTAB. Gastrointestinal: Soft , nondistended, nontender to palpation. No CVA tenderness. Musculoskeletal: No lower extremity tenderness nor edema.  No joint effusions. No signs of acute trauma. Neurologic:  Normal speech  and language. No gross focal neurologic deficits are appreciated. No gait instability noted. Skin:  Skin is warm, dry and intact. No rash noted. Psychiatric: Mood and affect are normal. Speech and behavior are normal. ____________________________________________   LABS (all labs ordered are listed, but only abnormal results are displayed)  Labs Reviewed  RESP PANEL BY RT-PCR (FLU A&B, COVID) [id number]   ____________________________________________  12 Lead EKG   ____________________________________________  RADIOLOGY  ED MD interpretation:    Official radiology report(s): No results found.  ____________________________________________   PROCEDURES and INTERVENTIONS  Procedure(s) performed (including Critical Care):  Procedures  Medications - No data to display  ____________________________________________   MDM / ED COURSE   Healthy 45 year old male presents to the ED with couple hours of new onset nasal congestion and transient nausea, with evidence of new onset viral URI amenable to outpatient management.  No negation for blood work and CXR.  We will swab him for COVID and flu and discharged with a prescription for Zofran considering his nausea.  Return precautions for the ED discussed.     ____________________________________________   FINAL CLINICAL IMPRESSION(S) / ED DIAGNOSES  Final diagnoses:  Nasal congestion  Upper respiratory tract infection, unspecified type     ED Discharge Orders          Ordered    ondansetron (ZOFRAN-ODT) 4 MG disintegrating tablet  Every 8 hours PRN        05/22/21 1200             Laquiesha Piacente   Note:  This document was prepared using Conservation officer, historic buildings and may include unintentional dictation errors.    Delton Prairie, MD 05/22/21 [phone]

## 2022-11-15 ENCOUNTER — Other Ambulatory Visit: Payer: Self-pay

## 2022-11-15 ENCOUNTER — Emergency Department
Admission: EM | Admit: 2022-11-15 | Discharge: 2022-11-15 | Disposition: A | Discharge status: Home / Self Care | Payer: Self-pay | Attending: Emergency Medicine | Admitting: Emergency Medicine

## 2022-11-15 DIAGNOSIS — M25512 Pain in left shoulder: Secondary | ICD-10-CM | POA: Insufficient documentation

## 2022-11-15 DIAGNOSIS — M25511 Pain in right shoulder: Secondary | ICD-10-CM | POA: Insufficient documentation

## 2022-11-15 DIAGNOSIS — M7090 Unspecified soft tissue disorder related to use, overuse and pressure of unspecified site: Secondary | ICD-10-CM | POA: Insufficient documentation

## 2022-11-15 DIAGNOSIS — M79603 Pain in arm, unspecified: Secondary | ICD-10-CM

## 2022-11-15 DIAGNOSIS — Y9389 Activity, other specified: Secondary | ICD-10-CM | POA: Insufficient documentation

## 2022-11-15 DIAGNOSIS — X503XXA Overexertion from repetitive movements, initial encounter: Secondary | ICD-10-CM

## 2022-11-15 HISTORY — DX: Sciatica, unspecified side: M54.30

## 2022-11-15 HISTORY — DX: Peptic ulcer, site unspecified, unspecified as acute or chronic, without hemorrhage or perforation: K27.9

## 2022-11-15 MED ORDER — CYCLOBENZAPRINE HCL 10 MG PO TABS
10.0000 mg | ORAL_TABLET | Freq: Once | ORAL | Status: AC
  Administered 2022-11-15: 10 mg via ORAL
  Filled 2022-11-15: qty 1

## 2022-11-15 MED ORDER — CYCLOBENZAPRINE HCL 5 MG PO TABS: 5.0000 mg | ORAL_TABLET | Freq: Three times a day (TID) | ORAL | 0 refills | Status: DC | PRN

## 2022-11-15 MED ORDER — IBUPROFEN 800 MG PO TABS
800.0000 mg | ORAL_TABLET | Freq: Once | ORAL | Status: AC
  Administered 2022-11-15: 800 mg via ORAL
  Filled 2022-11-15: qty 1

## 2022-11-15 MED ORDER — IBUPROFEN 800 MG PO TABS: 800.0000 mg | ORAL_TABLET | Freq: Three times a day (TID) | ORAL | 0 refills | Status: AC | PRN

## 2022-11-15 NOTE — Discharge Instructions (Signed)
Your exam is overall reassuring.  No signs of a rotator cuff injury, serious cervical radiculopathy, or stroke.  Symptoms likely due to muscle strain and overuse related to your work activities.  Take the prescription meds as directed.  Continue to hydrate using electrolyte water, and consider stretching before work activities.  Follow-up with one of the local many clinics or primary care offices listed below. Please go to the following website to schedule new (and existing) patient appointments:   [email or web address]   The following is a list of primary care offices in the area who are accepting new patients at this time.  Please reach out to one of them directly and let them know you would like to schedule an appointment to follow up on an Emergency Department visit, and/or to establish a new primary care provider (PCP).  There are likely other primary care clinics in the are who are accepting new patients, but this is an excellent place to start:  [facility] Lead physician: Dr Shirlee Latch [address] [place], [place] [address] [phone]  [facility] Lead Physician: Dr Alba Cory [address], [place], [place] [address] [phone]  [facility]  Lead Physician: Dr Olevia Perches [address] [place], [place] [address] [phone]  [facility] Lead Physician: Dr Sofie Hartigan [address] [place], [place] [address] [phone]  [facility] at [facility] Lead Physician: Dr Bari Edward [address] [place], [place], [place] [address] [phone]

## 2022-11-15 NOTE — ED Provider Notes (Signed)
[facility] Emergency Department Provider Note     Event Date/Time   First MD Initiated Contact with Patient 11/15/22 1821     (approximate)   History   Shoulder Pain  HPI  Richard Parks is a 47 y.o. male with a noncontributory history, presents to the ED for bilateral shoulder pain and intermittent paresthesias to the first 3 fingers of the bilateral hands.  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  Patient denies any frank trauma, head injury, facial droop, or upper extremity weakness.  He notes some mechanical pain with range of motion to the left shoulder.  He denies any chest pain, shortness of breath, or hand swelling.   Physical Exam   Triage Vital Signs: ED Triage Vitals  Enc Vitals Group     BP 11/15/22 1752 (!) 145/93     Pulse Rate 11/15/22 1752 (!) 103     Resp 11/15/22 1752 16     Temp 11/15/22 1752 98.3 F (36.8 C)     Temp Source 11/15/22 1752 Oral     SpO2 11/15/22 1752 95 %     Weight 11/15/22 1755 180 lb (81.6 kg)     Height 11/15/22 1755 5\' 9"  (1.753 m)     Head Circumference --      Peak Flow --      Pain Score 11/15/22 1753 3     Pain Loc --      Pain Edu? --      Excl. in GC? --     Most recent vital signs: Vitals:   11/15/22 1752  BP: (!) 145/93  Pulse: (!) 103  Resp: 16  Temp: 98.3 F (36.8 C)  SpO2: 95%    General Awake, no distress. NAD HEENT NCAT. PERRL. EOMI. No rhinorrhea. Mucous membranes are moist.  CV:  Good peripheral perfusion. RRR RESP:  Normal effort. CTA ABD:  No distention.  MSK:  Normal active range of motion of both upper extremities.,  Spinal alignment without midline tenderness, spasm, vomiting, or step-off.  Normal composite fist bilaterally.  Normal rotator cuff testing without evidence of internal derangement. NEURO: CN II-XII grossly intact.  Normal UE DTRs bilaterally.   Normal interest and opposition testing noted.  Cubital and carpal Tinel's bilaterally.   ED Results / Procedures / Treatments   Labs (all labs ordered are listed, but only abnormal results are displayed) Labs Reviewed - No data to display   EKG  RADIOLOGY  No results found.   PROCEDURES:  Critical Care performed: No  Procedures   MEDICATIONS ORDERED IN ED: Medications  cyclobenzaprine (FLEXERIL) tablet 10 mg (10 mg Oral Given 11/15/22 1930)  ibuprofen (ADVIL) tablet 800 mg (800 mg Oral Given 11/15/22 1930)     IMPRESSION / MDM / ASSESSMENT AND PLAN / ED COURSE  I reviewed the triage vital signs and the nursing notes.                              Differential diagnosis includes, but is not limited to, shoulder strain, cervical radiculopathy, overuse syndrome, distal nerve palsy, rotator cuff tendinitis  Patient's presentation is most consistent with acute, uncomplicated illness.  Patient's diagnosis is consistent with myalgias and distal paresthesias likely secondary to overuse syndrome.  No evidence of any acute neuromuscular deficits.  No  evidence of any internal derangement of the shoulders.  No concern for cervical radiculopathy as symptoms are bilateral.  Patient will be discharged home with prescriptions for cyclobenzaprine and ibuprofen patient is to follow up with primary provider or local urgent care as needed or otherwise directed. Patient is given ED precautions to return to the ED for any worsening or new symptoms.   FINAL CLINICAL IMPRESSION(S) / ED DIAGNOSES   Final diagnoses:  Musculoskeletal pain of upper extremity, unspecified laterality  Overuse injury     Rx / DC Orders   ED Discharge Orders          Ordered    cyclobenzaprine (FLEXERIL) 5 MG tablet  3 times daily PRN        11/15/22 1922    ibuprofen (ADVIL) 800 MG tablet  Every 8 hours PRN        11/15/22 1922             Note:  This document was prepared using Dragon voice  recognition software and may include unintentional dictation errors.    Lissa Hoard, PA-C 11/16/22 1139    Chesley Noon, MD 11/18/22 [phone]

## 2022-11-15 NOTE — ED Triage Notes (Signed)
Pt to ED for bilateral shoulder pain since past few days. States at times his fingers feels feel numb and pain shoots down arms. Pt does heavy lifting at work. Pt took aspirin a little while ago.

## 2023-06-22 ENCOUNTER — Other Ambulatory Visit: Payer: Self-pay

## 2023-06-22 ENCOUNTER — Emergency Department
Admission: EM | Admit: 2023-06-22 | Discharge: 2023-06-22 | Disposition: A | Discharge status: Home / Self Care | Payer: Self-pay | Attending: Emergency Medicine | Admitting: Emergency Medicine

## 2023-06-22 ENCOUNTER — Emergency Department: Payer: Self-pay

## 2023-06-22 DIAGNOSIS — M25511 Pain in right shoulder: Secondary | ICD-10-CM | POA: Insufficient documentation

## 2023-06-22 MED ORDER — LIDOCAINE 5 % EX PTCH
1.0000 | MEDICATED_PATCH | CUTANEOUS | Status: DC
  Administered 2023-06-22: 1 via TRANSDERMAL
  Filled 2023-06-22: qty 1

## 2023-06-22 MED ORDER — KETOROLAC TROMETHAMINE 30 MG/ML IJ SOLN
30.0000 mg | Freq: Once | INTRAMUSCULAR | Status: AC
  Administered 2023-06-22: 30 mg via INTRAMUSCULAR
  Filled 2023-06-22: qty 1

## 2023-06-22 MED ORDER — CYCLOBENZAPRINE HCL 5 MG PO TABS: 5.0000 mg | ORAL_TABLET | Freq: Three times a day (TID) | ORAL | 0 refills | Status: AC | PRN
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
## 2023-06-22 NOTE — Discharge Instructions (Addendum)
You were evaluated in the ED for right shoulder pain.  Your x-rays are normal.  There is no fracture or dislocation.  You will be placed in a sling and advised to remain in for comfort until symptoms improve.  Apply ice over the affected area 20 minutes on 20 minutes off.  Alternate with a heating pad.  Take Tylenol and ibuprofen for pain as needed.  Follow-up with orthopedics in 1 week if symptoms persist.
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
